# Patient Record
Sex: Female | Born: 2000 | Race: White | Hispanic: No | Marital: Married | Smoking: Never smoker
Health system: Southern US, Community
[De-identification: ages and names within clinical notes are randomized; demographics above are authoritative.]

## PROBLEM LIST (undated history)

## (undated) DIAGNOSIS — Z8489 Family history of other specified conditions: Secondary | ICD-10-CM

## (undated) DIAGNOSIS — F419 Anxiety disorder, unspecified: Secondary | ICD-10-CM

## (undated) DIAGNOSIS — K219 Gastro-esophageal reflux disease without esophagitis: Secondary | ICD-10-CM

## (undated) DIAGNOSIS — E282 Polycystic ovarian syndrome: Secondary | ICD-10-CM

## (undated) DIAGNOSIS — F32A Depression, unspecified: Secondary | ICD-10-CM

## (undated) DIAGNOSIS — R519 Headache, unspecified: Secondary | ICD-10-CM

## (undated) HISTORY — PX: WISDOM TOOTH EXTRACTION: SHX21

---
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
## 2018-02-11 ENCOUNTER — Encounter (HOSPITAL_COMMUNITY): Payer: Self-pay | Admitting: Emergency Medicine

## 2018-02-11 ENCOUNTER — Emergency Department (HOSPITAL_COMMUNITY)
Admission: EM | Admit: 2018-02-11 | Discharge: 2018-02-11 | Disposition: A | Discharge status: Home / Self Care | Payer: 59 | Attending: Emergency Medicine | Admitting: Emergency Medicine

## 2018-02-11 DIAGNOSIS — G43909 Migraine, unspecified, not intractable, without status migrainosus: Secondary | ICD-10-CM | POA: Diagnosis not present

## 2018-02-11 DIAGNOSIS — Z5321 Procedure and treatment not carried out due to patient leaving prior to being seen by health care provider: Secondary | ICD-10-CM | POA: Insufficient documentation

## 2018-02-11 HISTORY — DX: Polycystic ovarian syndrome: E28.2

## 2018-02-11 NOTE — ED Triage Notes (Signed)
Pt c/o migraine x "a few hours" today. Sensitivity to light and sound, some nausea. Hx of migraines, has tried OTC medications without relief.

## 2018-02-11 NOTE — ED Notes (Signed)
Pt and visitor leaving at this time, do not want to wait. LWBS

## 2018-02-12 NOTE — ED Notes (Signed)
02/12/2018, Follow -up call completed. 

## 2018-07-14 DIAGNOSIS — N760 Acute vaginitis: Secondary | ICD-10-CM | POA: Diagnosis not present

## 2018-07-14 DIAGNOSIS — Z118 Encounter for screening for other infectious and parasitic diseases: Secondary | ICD-10-CM | POA: Diagnosis not present

## 2018-09-10 DIAGNOSIS — Z6841 Body Mass Index (BMI) 40.0 and over, adult: Secondary | ICD-10-CM | POA: Diagnosis not present

## 2018-10-07 DIAGNOSIS — Z6841 Body Mass Index (BMI) 40.0 and over, adult: Secondary | ICD-10-CM | POA: Diagnosis not present

## 2018-12-02 DIAGNOSIS — Z118 Encounter for screening for other infectious and parasitic diseases: Secondary | ICD-10-CM | POA: Diagnosis not present

## 2018-12-02 DIAGNOSIS — Z6841 Body Mass Index (BMI) 40.0 and over, adult: Secondary | ICD-10-CM | POA: Diagnosis not present

## 2018-12-02 DIAGNOSIS — Z01419 Encounter for gynecological examination (general) (routine) without abnormal findings: Secondary | ICD-10-CM | POA: Diagnosis not present

## 2018-12-02 DIAGNOSIS — Z13220 Encounter for screening for lipoid disorders: Secondary | ICD-10-CM | POA: Diagnosis not present

## 2018-12-02 DIAGNOSIS — E282 Polycystic ovarian syndrome: Secondary | ICD-10-CM | POA: Diagnosis not present

## 2019-11-23 ENCOUNTER — Other Ambulatory Visit: Payer: Self-pay

## 2019-11-23 DIAGNOSIS — Z20822 Contact with and (suspected) exposure to covid-19: Secondary | ICD-10-CM

## 2019-11-25 LAB — NOVEL CORONAVIRUS, NAA: SARS-CoV-2, NAA: NOT DETECTED

## 2019-12-07 ENCOUNTER — Ambulatory Visit: Payer: 59 | Attending: Internal Medicine

## 2019-12-07 DIAGNOSIS — Z20822 Contact with and (suspected) exposure to covid-19: Secondary | ICD-10-CM

## 2019-12-08 LAB — NOVEL CORONAVIRUS, NAA: SARS-CoV-2, NAA: DETECTED — AB

## 2019-12-15 ENCOUNTER — Encounter: Payer: 59 | Admitting: Family Medicine

## 2019-12-22 ENCOUNTER — Ambulatory Visit: Payer: 59 | Attending: Internal Medicine

## 2019-12-22 DIAGNOSIS — Z20822 Contact with and (suspected) exposure to covid-19: Secondary | ICD-10-CM

## 2019-12-24 LAB — NOVEL CORONAVIRUS, NAA: SARS-CoV-2, NAA: NOT DETECTED

## 2020-01-24 ENCOUNTER — Encounter: Payer: 59 | Admitting: Family Medicine

## 2022-06-05 ENCOUNTER — Inpatient Hospital Stay (HOSPITAL_COMMUNITY)
Admission: AD | Admit: 2022-06-05 | Payer: Commercial Managed Care - PPO | Source: Home / Self Care | Admitting: Obstetrics and Gynecology

## 2022-06-27 LAB — OB RESULTS CONSOLE HEPATITIS B SURFACE ANTIGEN: Hepatitis B Surface Ag: NEGATIVE

## 2022-06-27 LAB — OB RESULTS CONSOLE HIV ANTIBODY (ROUTINE TESTING): HIV: NONREACTIVE

## 2022-06-27 LAB — HEPATITIS C ANTIBODY: HCV Ab: NEGATIVE

## 2022-06-27 LAB — OB RESULTS CONSOLE RUBELLA ANTIBODY, IGM: Rubella: IMMUNE

## 2022-08-11 ENCOUNTER — Encounter (HOSPITAL_BASED_OUTPATIENT_CLINIC_OR_DEPARTMENT_OTHER): Payer: Self-pay

## 2022-08-11 DIAGNOSIS — R1013 Epigastric pain: Secondary | ICD-10-CM | POA: Diagnosis not present

## 2022-08-11 DIAGNOSIS — O26892 Other specified pregnancy related conditions, second trimester: Secondary | ICD-10-CM | POA: Diagnosis present

## 2022-08-11 LAB — CBC WITH DIFFERENTIAL/PLATELET
Abs Immature Granulocytes: 0.03 10*3/uL (ref 0.00–0.07)
Basophils Absolute: 0 10*3/uL (ref 0.0–0.1)
Basophils Relative: 0 %
Eosinophils Absolute: 0 10*3/uL (ref 0.0–0.5)
Eosinophils Relative: 0 %
HCT: 37.3 % (ref 36.0–46.0)
Hemoglobin: 13.2 g/dL (ref 12.0–15.0)
Immature Granulocytes: 0 %
Lymphocytes Relative: 12 %
Lymphs Abs: 1.4 10*3/uL (ref 0.7–4.0)
MCH: 30.8 pg (ref 26.0–34.0)
MCHC: 35.4 g/dL (ref 30.0–36.0)
MCV: 86.9 fL (ref 80.0–100.0)
Monocytes Absolute: 0.7 10*3/uL (ref 0.1–1.0)
Monocytes Relative: 6 %
Neutro Abs: 9.7 10*3/uL — ABNORMAL HIGH (ref 1.7–7.7)
Neutrophils Relative %: 82 %
Platelets: 246 10*3/uL (ref 150–400)
RBC: 4.29 MIL/uL (ref 3.87–5.11)
RDW: 13.1 % (ref 11.5–15.5)
WBC: 11.8 10*3/uL — ABNORMAL HIGH (ref 4.0–10.5)
nRBC: 0 % (ref 0.0–0.2)

## 2022-08-11 LAB — COMPREHENSIVE METABOLIC PANEL
ALT: 64 U/L — ABNORMAL HIGH (ref 0–44)
AST: 84 U/L — ABNORMAL HIGH (ref 15–41)
Albumin: 4.1 g/dL (ref 3.5–5.0)
Alkaline Phosphatase: 85 U/L (ref 38–126)
Anion gap: 10 (ref 5–15)
BUN: 6 mg/dL (ref 6–20)
CO2: 24 mmol/L (ref 22–32)
Calcium: 9.7 mg/dL (ref 8.9–10.3)
Chloride: 101 mmol/L (ref 98–111)
Creatinine, Ser: 0.54 mg/dL (ref 0.44–1.00)
GFR, Estimated: >60 mL/min (ref >60)
Glucose, Bld: 113 mg/dL — ABNORMAL HIGH (ref 70–99)
Potassium: 3.6 mmol/L (ref 3.5–5.1)
Sodium: 135 mmol/L (ref 135–145)
Total Bilirubin: 1.4 mg/dL — ABNORMAL HIGH (ref 0.3–1.2)
Total Protein: 7.3 g/dL (ref 6.5–8.1)

## 2022-08-11 LAB — URINALYSIS, ROUTINE W REFLEX MICROSCOPIC
Bilirubin Urine: NEGATIVE
Glucose, UA: NEGATIVE mg/dL
Hgb urine dipstick: NEGATIVE
Ketones, ur: 15 mg/dL — AB
Leukocytes,Ua: NEGATIVE
Nitrite: NEGATIVE
Protein, ur: NEGATIVE mg/dL
Specific Gravity, Urine: 1.013 (ref 1.005–1.030)
pH: 6 (ref 5.0–8.0)

## 2022-08-11 LAB — LIPASE, BLOOD: Lipase: 37 U/L (ref 11–51)

## 2022-08-11 NOTE — ED Triage Notes (Signed)
Epigastric pain that is radiating thru to back +N/V began after dinner [redacted] wks pregnant

## 2022-08-12 ENCOUNTER — Emergency Department (HOSPITAL_BASED_OUTPATIENT_CLINIC_OR_DEPARTMENT_OTHER): Payer: Commercial Managed Care - PPO

## 2022-08-12 ENCOUNTER — Emergency Department (HOSPITAL_BASED_OUTPATIENT_CLINIC_OR_DEPARTMENT_OTHER)
Admission: EM | Admit: 2022-08-12 | Discharge: 2022-08-12 | Disposition: A | Discharge status: Home / Self Care | Payer: Commercial Managed Care - PPO | Attending: Emergency Medicine | Admitting: Emergency Medicine

## 2022-08-12 DIAGNOSIS — Z3A16 16 weeks gestation of pregnancy: Secondary | ICD-10-CM

## 2022-08-12 DIAGNOSIS — K8020 Calculus of gallbladder without cholecystitis without obstruction: Secondary | ICD-10-CM

## 2022-08-12 DIAGNOSIS — R1013 Epigastric pain: Secondary | ICD-10-CM

## 2022-08-12 MED ORDER — HYDROCODONE-ACETAMINOPHEN 5-325 MG PO TABS: 1.0000 | ORAL_TABLET | Freq: Four times a day (QID) | ORAL | 0 refills | Status: DC | PRN

## 2022-08-12 NOTE — Discharge Instructions (Addendum)
Take Tylenol 1000 mg every 6 hours as needed for pain.  Take hydrocodone as prescribed as needed for moderate to severe pain.  Follow-up with your OB/GYN on Thursday as scheduled, and return to the ER if you develop worsening pain, high fevers, bloody stools, or for other new and concerning symptoms.  You should avoid spicy or fatty foods as this may worsen your discomfort.

## 2022-08-12 NOTE — ED Provider Notes (Signed)
[facility] Provider Note   CSN: [id number] Arrival date & time: 08/11/22  2028     History  Chief Complaint  Patient presents with   Abdominal Pain    Laurie Rios is a 21 y.o. female.  Patient is a 21 year old female G1 at approximately [redacted] weeks gestation.  Patient presenting today with complaints of epigastric pain.  She reports eating tacos earlier this evening, then experienced severe "gas-like" pains to the epigastric region.  This lasted for approximately 30 minutes, then improved.  She describes ongoing discomfort, however not to the degree that she initially experienced.  She denies any vomiting.  She denies any fevers or chills.  She denies any prior abdominal surgeries.  The history is provided by the patient.       Home Medications Prior to Admission medications   Medication Sig Start Date End Date Taking? Authorizing Provider  ibuprofen (ADVIL,MOTRIN) 600 MG tablet Take 1 tablet (600 mg total) by mouth 3 (three) times daily. Patient taking differently: Take 600 mg by mouth every 6 (six) hours as needed.  09/12/16   Jearld Fenton, NP  sertraline (ZOLOFT) 50 MG tablet Take 50 mg by mouth daily.    [provider]      Allergies    Patient has no known allergies.    Review of Systems   Review of Systems  All other systems reviewed and are negative.   Physical Exam Updated Vital Signs BP 120/86   Pulse 100   Temp 98.2 F (36.8 C)   Resp 19   Ht '5\' 10"'  (1.778 m)   Wt (!) 139.3 kg   SpO2 99%   BMI 44.05 kg/m  Physical Exam Vitals and nursing note reviewed.  Constitutional:      General: She is not in acute distress.    Appearance: She is well-developed. She is not diaphoretic.  HENT:     Head: Normocephalic and atraumatic.  Cardiovascular:     Rate and Rhythm: Normal rate and regular rhythm.     Heart sounds: No murmur heard.    No friction rub. No gallop.  Pulmonary:     Effort: Pulmonary effort is normal.  No respiratory distress.     Breath sounds: Normal breath sounds. No wheezing.  Abdominal:     General: Bowel sounds are normal. There is no distension.     Palpations: Abdomen is soft.     Tenderness: There is abdominal tenderness in the epigastric area. There is no right CVA tenderness, left CVA tenderness, guarding or rebound.  Musculoskeletal:        General: Normal range of motion.     Cervical back: Normal range of motion and neck supple.  Skin:    General: Skin is warm and dry.  Neurological:     General: No focal deficit present.     Mental Status: She is alert and oriented to person, place, and time.     ED Results / Procedures / Treatments   Labs (all labs ordered are listed, but only abnormal results are displayed) Labs Reviewed  COMPREHENSIVE METABOLIC PANEL - Abnormal; Notable for the following components:      Result Value   Glucose, Bld 113 (*)    AST 84 (*)    ALT 64 (*)    Total Bilirubin 1.4 (*)    All other components within normal limits  CBC WITH DIFFERENTIAL/PLATELET - Abnormal; Notable for the following components:   WBC 11.8 (*)  Neutro Abs 9.7 (*)    All other components within normal limits  URINALYSIS, ROUTINE W REFLEX MICROSCOPIC - Abnormal; Notable for the following components:   Ketones, ur 15 (*)    All other components within normal limits  LIPASE, BLOOD    EKG None  Radiology US Abdomen Limited RUQ (LIVER/GB)  Result Date: 08/12/2022 CLINICAL DATA:  Right upper quadrant pain x1 day. EXAM: ULTRASOUND ABDOMEN LIMITED RIGHT UPPER QUADRANT COMPARISON:  None Available. FINDINGS: Gallbladder: Echogenic sludge and small shadowing echogenic gallstones are seen within the gallbladder lumen. The largest gallstone measures approximately 5 mm. There is no evidence of gallbladder wall thickening (2.1 mm). No sonographic Murphy sign noted by sonographer. Common bile duct: Diameter: 3.2 mm Liver: No focal lesion identified. Diffusely increased  echogenicity of the liver parenchyma is noted. Portal vein is patent on color Doppler imaging with normal direction of blood flow towards the liver. Other: None. IMPRESSION: 1. Gallstones and gallbladder sludge without evidence of cholelithiasis. 2. Hepatic steatosis without focal liver lesions. Electronically Signed   By: Virgina Norfolk M.D.   On: 08/12/2022 03:00    Procedures Procedures    Medications Ordered in ED Medications - No data to display  ED Course/ Medical Decision Making/ A&P  Patient is a 21 year old female G1 at [redacted] weeks gestation presenting with epigastric pain that sounds like a gallbladder attack.  Her pain came suddenly after eating tacos this evening.  Pain has improved significantly.  Laboratory studies are unremarkable with the exception of mild elevation of the transaminases, but normal alk phos.  Patient went for ultrasound showing sludge and gallstones, but no evidence for acute cholecystitis.  She does seem to be feeling better and I feel can safely be discharged.  She has a follow-up appointment with her OB this coming Thursday.  Patient understands to avoid spicy or fatty foods.  Return as needed if symptoms worsen or change.  Final Clinical Impression(s) / ED Diagnoses Final diagnoses:  None    Rx / DC Orders ED Discharge Orders     None         Veryl Speak, MD 08/12/22 [phone]

## 2022-10-31 LAB — OB RESULTS CONSOLE RPR: RPR: NONREACTIVE

## 2022-12-15 NOTE — L&D Delivery Note (Signed)
   Delivery Note:   G1P0 at [redacted]w[redacted]d  Admitting diagnosis: Normal labor [O80, Z37.9] Risks: gHTN, morbid obesity, depression on Zoloft Onset of labor: 01/05/2023 at 0430 IOL/Augmentation: AROM and Pitocin ROM: 01/05/2023 at 0956, clear fluid  Complete dilation at 01/05/2023  1653 Onset of pushing at 1806 FHR second stage Cat I  Analgesia/Anesthesia intrapartum:Epidural  Pushing in lithotomy position with CNM and L&D staff support. Husband, Laurie Rios, and mother, Laurie Rios, present for birth and supportive.  Delivery of a Live born female  Birth Weight:  pending APGAR: 75, 9  Newborn Delivery   Birth date/time: 01/05/2023 18:24:00 Delivery type: Vaginal, Spontaneous     in cephalic presentation, position OA to ROA.  APGAR:1 min-9 , 5 min-9   Nuchal Cord: No  Short cord and when baby placed on maternal abdomen, cord ruptured. Double clamped and cut. Minimal blood loss noted. Cord double clamped after cessation of pulsation, cut by Laurie Rios.  Collection of cord blood for typing completed. Arterial cord blood sample-No    Placenta delivered-Spontaneous  with 3 vessels . Uterotonics: Pitocin Placenta to L&D Uterine tone firm  Bleeding small  Sulcus;2nd degree;Perineal  laceration identified.  Episiotomy:None  Local analgesia: N/A  Repair: 2-0 in usual fashion with excellent hemostasis and approximation. Est. Blood Loss (PY):051.10   Complications: None  Mom to postpartum. Baby Laurie Rios to Couplet care / Skin to Skin.  Delivery Report:  Review the Delivery Report for details.    Suzan Nailer, CNM, MSN 01/05/2023, 7:00 PM

## 2022-12-26 LAB — OB RESULTS CONSOLE GBS: GBS: NEGATIVE

## 2022-12-26 LAB — OB RESULTS CONSOLE GC/CHLAMYDIA
Chlamydia: NEGATIVE
Neisseria Gonorrhea: NEGATIVE

## 2023-01-02 ENCOUNTER — Inpatient Hospital Stay (EMERGENCY_DEPARTMENT_HOSPITAL)
Admission: AD | Admit: 2023-01-02 | Discharge: 2023-01-02 | Disposition: A | Discharge status: Home / Self Care | Payer: Commercial Managed Care - PPO | Source: Home / Self Care | Attending: Obstetrics and Gynecology | Admitting: Obstetrics and Gynecology

## 2023-01-02 ENCOUNTER — Encounter (HOSPITAL_COMMUNITY): Payer: Self-pay

## 2023-01-02 DIAGNOSIS — O99343 Other mental disorders complicating pregnancy, third trimester: Secondary | ICD-10-CM | POA: Insufficient documentation

## 2023-01-02 DIAGNOSIS — Z3A37 37 weeks gestation of pregnancy: Secondary | ICD-10-CM | POA: Insufficient documentation

## 2023-01-02 DIAGNOSIS — O99283 Endocrine, nutritional and metabolic diseases complicating pregnancy, third trimester: Secondary | ICD-10-CM | POA: Insufficient documentation

## 2023-01-02 DIAGNOSIS — O133 Gestational [pregnancy-induced] hypertension without significant proteinuria, third trimester: Secondary | ICD-10-CM | POA: Insufficient documentation

## 2023-01-02 DIAGNOSIS — F419 Anxiety disorder, unspecified: Secondary | ICD-10-CM | POA: Insufficient documentation

## 2023-01-02 DIAGNOSIS — R519 Headache, unspecified: Secondary | ICD-10-CM | POA: Insufficient documentation

## 2023-01-02 DIAGNOSIS — F32A Depression, unspecified: Secondary | ICD-10-CM | POA: Insufficient documentation

## 2023-01-02 DIAGNOSIS — E282 Polycystic ovarian syndrome: Secondary | ICD-10-CM | POA: Insufficient documentation

## 2023-01-02 DIAGNOSIS — O134 Gestational [pregnancy-induced] hypertension without significant proteinuria, complicating childbirth: Secondary | ICD-10-CM | POA: Diagnosis not present

## 2023-01-02 DIAGNOSIS — O26893 Other specified pregnancy related conditions, third trimester: Secondary | ICD-10-CM | POA: Insufficient documentation

## 2023-01-02 DIAGNOSIS — Z79899 Other long term (current) drug therapy: Secondary | ICD-10-CM | POA: Insufficient documentation

## 2023-01-02 HISTORY — DX: Anxiety disorder, unspecified: F41.9

## 2023-01-02 HISTORY — DX: Depression, unspecified: F32.A

## 2023-01-02 LAB — TYPE AND SCREEN
ABO/RH(D): A POS
Antibody Screen: NEGATIVE

## 2023-01-02 LAB — COMPREHENSIVE METABOLIC PANEL
ALT: 13 U/L (ref 0–44)
AST: 17 U/L (ref 15–41)
Albumin: 2.7 g/dL — ABNORMAL LOW (ref 3.5–5.0)
Alkaline Phosphatase: 136 U/L — ABNORMAL HIGH (ref 38–126)
Anion gap: 9 (ref 5–15)
BUN: 7 mg/dL (ref 6–20)
CO2: 19 mmol/L — ABNORMAL LOW (ref 22–32)
Calcium: 9 mg/dL (ref 8.9–10.3)
Chloride: 105 mmol/L (ref 98–111)
Creatinine, Ser: 0.57 mg/dL (ref 0.44–1.00)
GFR, Estimated: >60 mL/min (ref >60)
Glucose, Bld: 81 mg/dL (ref 70–99)
Potassium: 3.9 mmol/L (ref 3.5–5.1)
Sodium: 133 mmol/L — ABNORMAL LOW (ref 135–145)
Total Bilirubin: 0.5 mg/dL (ref 0.3–1.2)
Total Protein: 6.4 g/dL — ABNORMAL LOW (ref 6.5–8.1)

## 2023-01-02 LAB — CBC
HCT: 37.7 % (ref 36.0–46.0)
Hemoglobin: 12.8 g/dL (ref 12.0–15.0)
MCH: 30.1 pg (ref 26.0–34.0)
MCHC: 34 g/dL (ref 30.0–36.0)
MCV: 88.7 fL (ref 80.0–100.0)
Platelets: 239 10*3/uL (ref 150–400)
RBC: 4.25 MIL/uL (ref 3.87–5.11)
RDW: 13 % (ref 11.5–15.5)
WBC: 9.9 10*3/uL (ref 4.0–10.5)
nRBC: 0 % (ref 0.0–0.2)

## 2023-01-02 LAB — PROTEIN / CREATININE RATIO, URINE
Creatinine, Urine: 51 mg/dL
Protein Creatinine Ratio: 0.24 mg/mg{Cre} — ABNORMAL HIGH (ref 0.00–0.15)
Total Protein, Urine: 12 mg/dL

## 2023-01-02 MED ORDER — ACETAMINOPHEN-CAFFEINE 500-65 MG PO TABS
2.0000 | ORAL_TABLET | Freq: Once | ORAL | Status: AC
  Administered 2023-01-02: 2 via ORAL
  Filled 2023-01-02: qty 2

## 2023-01-02 NOTE — MAU Note (Signed)
...  Laurie Rios is a 22 y.o. at [redacted]w[redacted]d here in MAU reporting: Sent over from the office for blood pressure evaluation. She reports her pressures in the office were 155/?, 177/90, and 135/101. She denies visual disturbances and epigastric/RUQ pain. HA since 1000 this morning. She reports she has not taken any medication for her HA. Reports a history of HA's and migraines. Denies VB or LOF. +FM.   Elevated BP's in the office noted in patient's prenatal record on 1/12. 138/92 and 140/92.  Onset of complaint: Today Pain score: 2/10 HA - anterior   FHT: 155 initial external Lab orders placed from triage:  UA

## 2023-01-02 NOTE — MAU Provider Note (Addendum)
History     CSN: [id number]  Arrival date and time: 01/02/23 1533   Event Date/Time   First Provider Initiated Contact with Patient 01/02/23 1604      Chief Complaint  Patient presents with   Hypertension   Headache   HPI Laurie Rios is a 22 y.o. G1P0 at [redacted]w[redacted]d who presents to MAU from office for evaluation following severe range BP.  Patient reports readings of 177/90 and 135/101 in the office today. She states she had mildly elevated blood pressures last week in the office. She denies RUQ/epigastric pain, new onset swelling or weight gain.  Patient also reports headache in the setting of history of headaches. Pain score is 2-3/10. She would not typically take medication for a headache of this level.   Sung receives care with [facility]. Dr. Lanny Cramp is her primary.  OB History     Gravida  1   Para      Term      Preterm      AB      Living         SAB      IAB      Ectopic      Multiple      Live Births              Past Medical History:  Diagnosis Date   Anxiety    Depression    PCOS (polycystic ovarian syndrome)     Past Surgical History:  Procedure Laterality Date   WISDOM TOOTH EXTRACTION      Family History  Problem Relation Age of Onset   Diabetes Mother    Diabetes Father    Diabetes Maternal Grandmother     Social History   Tobacco Use   Smoking status: Never   Smokeless tobacco: Never  Vaping Use   Vaping Use: Never used  Substance Use Topics   Alcohol use: No   Drug use: No    Allergies: No Known Allergies  Medications Prior to Admission  Medication Sig Dispense Refill Last Dose   omeprazole (PRILOSEC OTC) 20 MG tablet Take 20 mg by mouth daily.   01/01/2023   sertraline (ZOLOFT) 50 MG tablet Take 50 mg by mouth daily.   01/01/2023   HYDROcodone-acetaminophen (NORCO) 5-325 MG tablet Take 1-2 tablets by mouth every 6 (six) hours as needed. 10 tablet 0    ibuprofen (ADVIL,MOTRIN) 600 MG tablet Take 1 tablet (600 mg total)  by mouth 3 (three) times daily. (Patient taking differently: Take 600 mg by mouth every 6 (six) hours as needed. ) 30 tablet 0     Review of Systems  Neurological:  Positive for headaches.  All other systems reviewed and are negative.  Physical Exam   Blood pressure 122/87, pulse (!) 104, temperature 98.1 F (36.7 C), temperature source Oral, resp. rate 17, height 5' 9.5" (1.765 m), SpO2 99 %.  Physical Exam Vitals and nursing note reviewed.  Constitutional:      Appearance: She is well-developed. She is not ill-appearing.  Cardiovascular:     Rate and Rhythm: Normal rate.  Pulmonary:     Effort: Pulmonary effort is normal.  Abdominal:     Comments: Gravid  Musculoskeletal:     Right lower leg: 2+ Edema present.     Left lower leg: 2+ Edema present.  Skin:    Capillary Refill: Capillary refill takes less than 2 seconds.  Neurological:     Mental Status: She is alert.  Psychiatric:        Mood and Affect: Mood normal.        Speech: Speech normal.        Behavior: Behavior normal.     MAU Course  Procedures  MDM  - Reactive tracing: baseline 135, mod var, + accels, no decels  - Toco: rare contractions  -138/112 BP reading contains documentation that cuff was malpositioned, not an accurate reading  - Dr. Murrell Redden contacted at 1800. She will see if there are openings for IOL this weekend. If not, patient will be worked into office schedule Monday 01/05/23  Orders Placed This Encounter  Procedures   Protein / creatinine ratio, urine   CBC   Comprehensive metabolic panel   RPR   Measure blood pressure   Type and screen [place]   Insert peripheral IV   Saline lock IV   Patient Vitals for the past 24 hrs:  BP Temp Temp src Pulse Resp SpO2 Height  01/02/23 1801 (!) 140/101 -- -- (!) 109 -- -- --  01/02/23 1749 (!) 119/58 -- -- 94 -- -- --  01/02/23 1746 (!) 138/112 -- -- 77 -- -- --  01/02/23 1731 (!) 122/48 -- -- 94 -- -- --  01/02/23  1716 137/83 -- -- 92 -- -- --  01/02/23 1701 128/78 -- -- 89 -- -- --  01/02/23 1646 132/82 -- -- 100 -- -- --  01/02/23 1627 122/87 -- -- (!) 104 -- -- --  01/02/23 1625 (!) 108/90 -- -- (!) 108 -- -- --  01/02/23 1553 (!) 146/73 98.1 F (36.7 C) Oral (!) 170 17 99 % 5' 9.5" (1.765 m)   Assessment and Plan  --22 y.o. G1P0 at [redacted]w[redacted]d  --Reactive tracing --Gestational Hypertension --PEC labs WNL --Headache resolved prior to discharge --Discharge home in stable condition with Preeclampsia precautions  F/U: --Dr. Murrell Redden coordinating office appointment Monday for BP check and possible BPP --Patient should be prepared for possible admission following Monday's appointment  Darlina Rumpf, [place], MSN, CNM 01/02/2023, 7:40 PM

## 2023-01-03 LAB — RPR: RPR Ser Ql: NONREACTIVE

## 2023-01-05 ENCOUNTER — Inpatient Hospital Stay (HOSPITAL_COMMUNITY)
Admission: AD | Admit: 2023-01-05 | Discharge: 2023-01-07 | DRG: 807 | Disposition: A | Discharge status: Home / Self Care | Payer: Commercial Managed Care - PPO | Attending: Obstetrics & Gynecology | Admitting: Obstetrics & Gynecology

## 2023-01-05 ENCOUNTER — Inpatient Hospital Stay (HOSPITAL_COMMUNITY): Payer: Commercial Managed Care - PPO | Admitting: Anesthesiology

## 2023-01-05 ENCOUNTER — Encounter (HOSPITAL_COMMUNITY): Payer: Self-pay | Admitting: Obstetrics & Gynecology

## 2023-01-05 ENCOUNTER — Other Ambulatory Visit: Payer: Self-pay

## 2023-01-05 DIAGNOSIS — Z8659 Personal history of other mental and behavioral disorders: Secondary | ICD-10-CM

## 2023-01-05 DIAGNOSIS — O99214 Obesity complicating childbirth: Secondary | ICD-10-CM | POA: Diagnosis present

## 2023-01-05 DIAGNOSIS — O134 Gestational [pregnancy-induced] hypertension without significant proteinuria, complicating childbirth: Principal | ICD-10-CM | POA: Diagnosis present

## 2023-01-05 DIAGNOSIS — O139 Gestational [pregnancy-induced] hypertension without significant proteinuria, unspecified trimester: Secondary | ICD-10-CM | POA: Diagnosis present

## 2023-01-05 DIAGNOSIS — Z3A37 37 weeks gestation of pregnancy: Secondary | ICD-10-CM

## 2023-01-05 DIAGNOSIS — F32A Depression, unspecified: Secondary | ICD-10-CM | POA: Diagnosis present

## 2023-01-05 DIAGNOSIS — F419 Anxiety disorder, unspecified: Secondary | ICD-10-CM | POA: Diagnosis present

## 2023-01-05 DIAGNOSIS — O9902 Anemia complicating childbirth: Secondary | ICD-10-CM | POA: Diagnosis not present

## 2023-01-05 DIAGNOSIS — O99344 Other mental disorders complicating childbirth: Secondary | ICD-10-CM | POA: Diagnosis present

## 2023-01-05 LAB — CBC WITH DIFFERENTIAL/PLATELET
Abs Immature Granulocytes: 0.06 10*3/uL (ref 0.00–0.07)
Basophils Absolute: 0 10*3/uL (ref 0.0–0.1)
Basophils Relative: 0 %
Eosinophils Absolute: 0 10*3/uL (ref 0.0–0.5)
Eosinophils Relative: 0 %
HCT: 33 % — ABNORMAL LOW (ref 36.0–46.0)
Hemoglobin: 11.7 g/dL — ABNORMAL LOW (ref 12.0–15.0)
Immature Granulocytes: 0 %
Lymphocytes Relative: 8 %
Lymphs Abs: 1.3 10*3/uL (ref 0.7–4.0)
MCH: 30.7 pg (ref 26.0–34.0)
MCHC: 35.5 g/dL (ref 30.0–36.0)
MCV: 86.6 fL (ref 80.0–100.0)
Monocytes Absolute: 0.9 10*3/uL (ref 0.1–1.0)
Monocytes Relative: 6 %
Neutro Abs: 14.2 10*3/uL — ABNORMAL HIGH (ref 1.7–7.7)
Neutrophils Relative %: 86 %
Platelets: 227 10*3/uL (ref 150–400)
RBC: 3.81 MIL/uL — ABNORMAL LOW (ref 3.87–5.11)
RDW: 13.1 % (ref 11.5–15.5)
WBC: 16.5 10*3/uL — ABNORMAL HIGH (ref 4.0–10.5)
nRBC: 0 % (ref 0.0–0.2)

## 2023-01-05 LAB — HIV ANTIBODY (ROUTINE TESTING W REFLEX): HIV Screen 4th Generation wRfx: NONREACTIVE

## 2023-01-05 LAB — CBC
HCT: 34.8 % — ABNORMAL LOW (ref 36.0–46.0)
Hemoglobin: 12.1 g/dL (ref 12.0–15.0)
MCH: 30.2 pg (ref 26.0–34.0)
MCHC: 34.8 g/dL (ref 30.0–36.0)
MCV: 86.8 fL (ref 80.0–100.0)
Platelets: 215 10*3/uL (ref 150–400)
RBC: 4.01 MIL/uL (ref 3.87–5.11)
RDW: 13.1 % (ref 11.5–15.5)
WBC: 9.4 10*3/uL (ref 4.0–10.5)
nRBC: 0 % (ref 0.0–0.2)

## 2023-01-05 LAB — COMPREHENSIVE METABOLIC PANEL
ALT: 13 U/L (ref 0–44)
AST: 18 U/L (ref 15–41)
Albumin: 2.6 g/dL — ABNORMAL LOW (ref 3.5–5.0)
Alkaline Phosphatase: 134 U/L — ABNORMAL HIGH (ref 38–126)
Anion gap: 10 (ref 5–15)
BUN: 7 mg/dL (ref 6–20)
CO2: 19 mmol/L — ABNORMAL LOW (ref 22–32)
Calcium: 8.7 mg/dL — ABNORMAL LOW (ref 8.9–10.3)
Chloride: 105 mmol/L (ref 98–111)
Creatinine, Ser: 0.57 mg/dL (ref 0.44–1.00)
GFR, Estimated: >60 mL/min (ref >60)
Glucose, Bld: 103 mg/dL — ABNORMAL HIGH (ref 70–99)
Potassium: 3.4 mmol/L — ABNORMAL LOW (ref 3.5–5.1)
Sodium: 134 mmol/L — ABNORMAL LOW (ref 135–145)
Total Bilirubin: 0.6 mg/dL (ref 0.3–1.2)
Total Protein: 6.1 g/dL — ABNORMAL LOW (ref 6.5–8.1)

## 2023-01-05 LAB — TYPE AND SCREEN
ABO/RH(D): A POS
Antibody Screen: NEGATIVE

## 2023-01-05 LAB — RPR: RPR Ser Ql: NONREACTIVE

## 2023-01-05 MED ORDER — ZOLPIDEM TARTRATE 5 MG PO TABS: 5.0000 mg | ORAL_TABLET | Freq: Every evening | ORAL | Status: DC | PRN

## 2023-01-05 MED ORDER — ONDANSETRON HCL 4 MG/2ML IJ SOLN
4.0000 mg | Freq: Four times a day (QID) | INTRAMUSCULAR | Status: DC | PRN
  Administered 2023-01-05: 4 mg via INTRAVENOUS
  Filled 2023-01-05: qty 2

## 2023-01-05 MED ORDER — SODIUM CHLORIDE 0.9% FLUSH: 3.0000 mL | INTRAVENOUS | Status: DC | PRN

## 2023-01-05 MED ORDER — FENTANYL-BUPIVACAINE-NACL 0.5-0.125-0.9 MG/250ML-% EP SOLN: 12.0000 mL/h | EPIDURAL | Status: DC | PRN

## 2023-01-05 MED ORDER — SENNOSIDES-DOCUSATE SODIUM 8.6-50 MG PO TABS
2.0000 | ORAL_TABLET | Freq: Every day | ORAL | Status: DC
  Administered 2023-01-06 – 2023-01-07 (×2): 2 via ORAL
  Filled 2023-01-05 (×2): qty 2

## 2023-01-05 MED ORDER — OXYTOCIN-SODIUM CHLORIDE 30-0.9 UT/500ML-% IV SOLN
1.0000 m[IU]/min | INTRAVENOUS | Status: DC
  Administered 2023-01-05: 2 m[IU]/min via INTRAVENOUS

## 2023-01-05 MED ORDER — OXYTOCIN-SODIUM CHLORIDE 30-0.9 UT/500ML-% IV SOLN
2.5000 [IU]/h | INTRAVENOUS | Status: DC
  Filled 2023-01-05: qty 500

## 2023-01-05 MED ORDER — ACETAMINOPHEN 500 MG PO TABS: 1000.0000 mg | ORAL_TABLET | Freq: Four times a day (QID) | ORAL | Status: DC | PRN

## 2023-01-05 MED ORDER — LIDOCAINE HCL (PF) 1 % IJ SOLN
INTRAMUSCULAR | Status: DC | PRN
  Administered 2023-01-05: 8 mL via EPIDURAL

## 2023-01-05 MED ORDER — TETANUS-DIPHTH-ACELL PERTUSSIS 5-2.5-18.5 LF-MCG/0.5 IM SUSY: 0.5000 mL | PREFILLED_SYRINGE | Freq: Once | INTRAMUSCULAR | Status: DC

## 2023-01-05 MED ORDER — TERBUTALINE SULFATE 1 MG/ML IJ SOLN: 0.2500 mg | Freq: Once | INTRAMUSCULAR | Status: DC | PRN

## 2023-01-05 MED ORDER — PHENYLEPHRINE 80 MCG/ML (10ML) SYRINGE FOR IV PUSH (FOR BLOOD PRESSURE SUPPORT): 80.0000 ug | PREFILLED_SYRINGE | INTRAVENOUS | Status: DC | PRN

## 2023-01-05 MED ORDER — LACTATED RINGERS IV SOLN: 500.0000 mL | INTRAVENOUS | Status: DC | PRN

## 2023-01-05 MED ORDER — LIDOCAINE HCL (PF) 1 % IJ SOLN: 30.0000 mL | INTRAMUSCULAR | Status: DC | PRN

## 2023-01-05 MED ORDER — LACTATED RINGERS IV SOLN: INTRAVENOUS | Status: DC

## 2023-01-05 MED ORDER — IBUPROFEN 600 MG PO TABS
600.0000 mg | ORAL_TABLET | Freq: Four times a day (QID) | ORAL | Status: DC
  Administered 2023-01-06 – 2023-01-07 (×6): 600 mg via ORAL
  Filled 2023-01-05 (×6): qty 1

## 2023-01-05 MED ORDER — ONDANSETRON HCL 4 MG PO TABS: 4.0000 mg | ORAL_TABLET | ORAL | Status: DC | PRN

## 2023-01-05 MED ORDER — WITCH HAZEL-GLYCERIN EX PADS: 1.0000 | MEDICATED_PAD | CUTANEOUS | Status: DC | PRN

## 2023-01-05 MED ORDER — DIPHENHYDRAMINE HCL 25 MG PO CAPS: 25.0000 mg | ORAL_CAPSULE | Freq: Four times a day (QID) | ORAL | Status: DC | PRN

## 2023-01-05 MED ORDER — COCONUT OIL OIL: 1.0000 | TOPICAL_OIL | Status: DC | PRN

## 2023-01-05 MED ORDER — FENTANYL-BUPIVACAINE-NACL 0.5-0.125-0.9 MG/250ML-% EP SOLN
12.0000 mL/h | EPIDURAL | Status: DC | PRN
  Administered 2023-01-05: 12 mL/h via EPIDURAL
  Filled 2023-01-05: qty 250

## 2023-01-05 MED ORDER — DIBUCAINE (PERIANAL) 1 % EX OINT: 1.0000 | TOPICAL_OINTMENT | CUTANEOUS | Status: DC | PRN

## 2023-01-05 MED ORDER — OXYTOCIN 10 UNIT/ML IJ SOLN: 10.0000 [IU] | Freq: Once | INTRAMUSCULAR | Status: DC

## 2023-01-05 MED ORDER — PRENATAL MULTIVITAMIN CH
1.0000 | ORAL_TABLET | Freq: Every day | ORAL | Status: DC
  Administered 2023-01-05 – 2023-01-06 (×2): 1 via ORAL
  Filled 2023-01-05 (×2): qty 1

## 2023-01-05 MED ORDER — SIMETHICONE 80 MG PO CHEW: 80.0000 mg | CHEWABLE_TABLET | ORAL | Status: DC | PRN

## 2023-01-05 MED ORDER — SOD CITRATE-CITRIC ACID 500-334 MG/5ML PO SOLN: 30.0000 mL | ORAL | Status: DC | PRN

## 2023-01-05 MED ORDER — EPHEDRINE 5 MG/ML INJ: 10.0000 mg | INTRAVENOUS | Status: DC | PRN

## 2023-01-05 MED ORDER — ACETAMINOPHEN 325 MG PO TABS
650.0000 mg | ORAL_TABLET | ORAL | Status: DC | PRN
  Administered 2023-01-06 – 2023-01-07 (×3): 650 mg via ORAL
  Filled 2023-01-05 (×3): qty 2

## 2023-01-05 MED ORDER — SERTRALINE HCL 50 MG PO TABS
50.0000 mg | ORAL_TABLET | Freq: Every day | ORAL | Status: DC
  Administered 2023-01-05 – 2023-01-06 (×2): 50 mg via ORAL
  Filled 2023-01-05 (×2): qty 1

## 2023-01-05 MED ORDER — SODIUM CHLORIDE 0.9 % IV SOLN: INTRAVENOUS | Status: DC | PRN

## 2023-01-05 MED ORDER — DIPHENHYDRAMINE HCL 50 MG/ML IJ SOLN: 12.5000 mg | INTRAMUSCULAR | Status: DC | PRN

## 2023-01-05 MED ORDER — LACTATED RINGERS IV SOLN
500.0000 mL | Freq: Once | INTRAVENOUS | Status: AC
  Administered 2023-01-05: 500 mL via INTRAVENOUS

## 2023-01-05 MED ORDER — ONDANSETRON HCL 4 MG/2ML IJ SOLN: 4.0000 mg | INTRAMUSCULAR | Status: DC | PRN

## 2023-01-05 MED ORDER — BENZOCAINE-MENTHOL 20-0.5 % EX AERO
1.0000 | INHALATION_SPRAY | CUTANEOUS | Status: DC | PRN
  Administered 2023-01-06: 1 via TOPICAL
  Filled 2023-01-05: qty 56

## 2023-01-05 MED ORDER — SODIUM CHLORIDE 0.9% FLUSH: 3.0000 mL | Freq: Two times a day (BID) | INTRAVENOUS | Status: DC

## 2023-01-05 MED ORDER — OXYTOCIN BOLUS FROM INFUSION
333.0000 mL | Freq: Once | INTRAVENOUS | Status: AC
  Administered 2023-01-05: 333 mL via INTRAVENOUS

## 2023-01-05 MED ORDER — FENTANYL CITRATE (PF) 100 MCG/2ML IJ SOLN
50.0000 ug | INTRAMUSCULAR | Status: DC | PRN
  Administered 2023-01-05 (×2): 100 ug via INTRAVENOUS
  Filled 2023-01-05 (×2): qty 2

## 2023-01-05 NOTE — Progress Notes (Signed)
S: Working through contractions. Has had several doses of Fentanyl. Family at the bedside.  O: Vitals:   01/05/23 1332 01/05/23 1339 01/05/23 1342 01/05/23 1348  BP: (!) 157/101 (!) 169/99 (!) 157/89 (!) 147/82  Pulse: 74 92 72 76  Resp: 18 18 18 18   Temp:      TempSrc:      SpO2: 97% 100% 99%   Weight:      Height:       FHT:  FHR: 115 bpm, variability: moderate,  accelerations:  Present,  decelerations:  Absent UC:   regular, every 2-3 minutes SVE:   Dilation: 6 Effacement (%): 100 Station: -2 Exam by:: A. Nickol Collister CNM  A / P: Augmentation of labor, progressing well  Fetal Wellbeing:  Category I PEC: BPs mild range, denies PEC symptoms, labs stable, no signs or symptoms of toxicity GBS: Negative Pain Control:  Epidural Anticipated MOD:  NSVD  Plan for epidural now. Continue Pitocin. Encourage frequent position changes to facilitate fetal rotation and descent.  Dr. Ronita Hipps updated on patient status and plan of care.   Suzan Nailer, CNM, MSN 01/05/2023, 1:51 PM

## 2023-01-05 NOTE — MAU Note (Signed)
Laurie Rios is a 22 y.o. at [redacted]w[redacted]d here in MAU reporting: ctx since 0430 every 5-74min. Denies LOF or bleeding. Last SVE in office was 1cm on Friday. Positive FM

## 2023-01-05 NOTE — H&P (Signed)
Laurie Rios is a 22 y.o. female presenting for early labor. She also has gestational HTN diagnosis on 1/19 MAU visit and she was advised office f/up due to BPs improving and no room for IOL on 1/19 She presents w/ UCs since 3-4 hrs, q  6-7 min. No LOF or Vag bleeding. +FMs noted. Denies HA/ vision changes and RUQ pain.   [facility] Dr Lanny Cramp her Primary Ob Dating by 7 wk sono PCOS, spontaneous conception BMI 40+, famHx of PEC and G1-- bbASA for PEC risk reduction Anxiety/Depression - Zoloft 50mg  helping  Gall stones w/ ED visit  NIPT low risk, Rub Immune. VZ- Non immune, GBS neg  Anatomy normal  Growth at 36 wks 7 lbs 76% AC 94% Vx, nl AFI  No GDM Wkly NST from 36 wks reactive  MAU 1/19 for high BPs, labs nl. P/C 0.24 and was sent home    OB History     Gravida  1   Para      Term      Preterm      AB      Living         SAB      IAB      Ectopic      Multiple      Live Births             Past Medical History:  Diagnosis Date   Anxiety    Depression    PCOS (polycystic ovarian syndrome)    Past Surgical History:  Procedure Laterality Date   WISDOM TOOTH EXTRACTION     Family History: family history includes Diabetes in her father, maternal grandmother, and mother. Social History:  reports that she has never smoked. She has never used smokeless tobacco. She reports that she does not drink alcohol and does not use drugs.     Maternal Diabetes: No Genetic Screening: Normal Maternal Ultrasounds/Referrals: Normal Fetal Ultrasounds or other Referrals:  None Maternal Substance Abuse:  No Significant Maternal Medications:  Meds include: Other: bbASA, Omeprazole, Zoloft 50mg   Significant Maternal Lab Results:  Group B Strep negative Number of Prenatal Visits:greater than 3 verified prenatal visits Other Comments:  None  Review of Systems History Dilation: 3.5 Effacement (%): 70 Station: -3 Exam by:: E Jackson RN Blood pressure 139/81,  pulse 93, temperature 98 F (36.7 C), temperature source Oral, resp. rate 18, height 5\' 9"  (1.753 m), weight (!) 147.6 kg. Exam Physical Exam  Prenatal labs: ABO, Rh: --/--/PENDING (01/22 2130) Antibody: PENDING (01/22 0848) Rubella:  Immune VZ- NON Immune  RPR: NON REACTIVE (01/19 1555)  HBsAg:   Neg  Hep C neg  HIV:   Neg GBS:  Neg  Glucola passed  NIPT low risk   Assessment/Plan: 22 yo G1 at 37.5 weeks here in early labor and has diagnosis of Gestational HTN PIH labs now  GBS neg Plan AROM and assess if pitocin needed EFW 7.1/2 lbs, working towards SVD Epidural in active labor, encourage ambulation if BPs non severe   Anxiety/depression - cont PP Zoloft 50 mg VZ- non immune, needs VZ vaccine postpartum   Laurie Rios 01/05/2023, 9:19 AM

## 2023-01-05 NOTE — Anesthesia Preprocedure Evaluation (Signed)
Anesthesia Evaluation  Patient identified by MRN, date of birth, ID band Patient awake    Reviewed: Allergy & Precautions, H&P , NPO status , Patient's Chart, lab work & pertinent test results  Airway Mallampati: I  TM Distance: >3 FB Neck ROM: Full    Dental no notable dental hx. (+) Teeth Intact, Dental Advisory Given   Pulmonary neg pulmonary ROS   Pulmonary exam normal breath sounds clear to auscultation       Cardiovascular hypertension, Normal cardiovascular exam Rhythm:Regular Rate:Normal     Neuro/Psych  PSYCHIATRIC DISORDERS Anxiety Depression    negative neurological ROS  negative psych ROS   GI/Hepatic negative GI ROS, Neg liver ROS,GERD  ,,  Endo/Other  negative endocrine ROS    Renal/GU negative Renal ROS  negative genitourinary   Musculoskeletal negative musculoskeletal ROS (+)    Abdominal  (+) + obese  Peds negative pediatric ROS (+)  Hematology negative hematology ROS (+)   Anesthesia Other Findings   Reproductive/Obstetrics negative OB ROS                             Anesthesia Physical Anesthesia Plan  ASA: 3  Anesthesia Plan: Epidural   Post-op Pain Management: Minimal or no pain anticipated   Induction: Intravenous  PONV Risk Score and Plan: 2 and Treatment may vary due to age or medical condition  Airway Management Planned: Natural Airway and Simple Face Mask  Additional Equipment: None  Intra-op Plan:   Post-operative Plan:   Informed Consent: I have reviewed the patients History and Physical, chart, labs and discussed the procedure including the risks, benefits and alternatives for the proposed anesthesia with the patient or authorized representative who has indicated his/her understanding and acceptance.     Dental Advisory Given  Plan Discussed with: Anesthesiologist  Anesthesia Plan Comments: (Labs checked- platelets confirmed with RN in room.  Fetal heart tracing, per RN, reported to be stable enough for sitting procedure. Discussed epidural, and patient consents to the procedure:  included risk of possible headache,backache, failed block, allergic reaction, and nerve injury. This patient was asked if she had any questions or concerns before the procedure started.)       Anesthesia Quick Evaluation

## 2023-01-05 NOTE — Anesthesia Procedure Notes (Signed)
Epidural Patient location during procedure: OB Start time: 01/05/2023 1:26 PM End time: 01/05/2023 1:32 PM  Staffing Anesthesiologist: Janeece Riggers, MD Performed: resident/CRNA   Preanesthetic Checklist Completed: patient identified, IV checked, site marked, risks and benefits discussed, surgical consent, monitors and equipment checked, pre-op evaluation and timeout performed  Epidural Patient position: sitting Prep: DuraPrep and site prepped and draped Patient monitoring: continuous pulse ox and blood pressure Approach: midline Location: L3-L4 Injection technique: LOR air  Needle:  Needle type: Tuohy  Needle gauge: 17 G Needle length: 9 cm and 9 Needle insertion depth: 9 cm Catheter type: closed end flexible Catheter size: 19 Gauge Catheter at skin depth: 10 cm Test dose: negative  Assessment Events: blood not aspirated, no cerebrospinal fluid, injection not painful, no injection resistance, no paresthesia and negative IV test

## 2023-01-05 NOTE — Progress Notes (Signed)
Laurie Rios is a 22 y.o. G1P0 at [redacted]w[redacted]d by 7 wk sono not c/w LMP.  admitted for early labor with Gest HTN diagnosed oin 1/19   Subjective: UCs are stronger q 5-6 min.   Objective: BP (!) 136/93   Pulse 92   Temp 97.6 F (36.4 C) (Oral)   Resp 18   Ht 5\' 9"  (1.753 m)   Wt (!) 147.6 kg   BMI 48.07 kg/m  Patient Vitals for the past 24 hrs:  BP Temp Temp src Pulse Resp Height Weight  01/05/23 1003 (!) 136/93 97.6 F (36.4 C) Oral 92 18 -- --  01/05/23 0858 139/81 98 F (36.7 C) Oral 93 18 -- --  01/05/23 0803 (!) 155/93 -- -- 92 -- -- --  01/05/23 0738 (!) 148/70 98 F (36.7 C) Oral 99 16 5\' 9"  (1.753 m) (!) 147.6 kg     FHR: 140 bpm, variability: moderate,  accelerations:  Present,  decelerations:  Absent UC:   irregular, every 5-6 minutes SVE:   Dilation: 4 Effacement (%): 100 Station: -3 Exam by:: Dr. Benjie Karvonen AROM clear. Head well applied in cervix, pelvis adequate   Labs: Lab Results  Component Value Date   WBC 9.4 01/05/2023   HGB 12.1 01/05/2023   HCT 34.8 (L) 01/05/2023   MCV 86.8 01/05/2023   PLT 215 01/05/2023   CMP pending, RN aware to call if abn   Assessment / Plan: G1 37.5 wks early spontaneous labor, with Gestational HTN   Urine P/c 0.24 on 1/19, so defer repeating. CMP sent, BPs non severe, no neural ss.   Labor: Progressing normally Preeclampsia:   No Fetal Wellbeing:  Category I Pain Control:   Options dw pt, will consider epidiural  I/D:  n/a Anticipated MOD:  NSVD, 7-7.1/2 lbs.   Pt and family aware Dr Ronita Hipps on call at 1 pm and will have him and CNM Jones work with her for delivery, she accepts CNM care.   [name], MD 01/05/2023, 10:04 AM

## 2023-01-06 DIAGNOSIS — O9902 Anemia complicating childbirth: Secondary | ICD-10-CM | POA: Diagnosis not present

## 2023-01-06 LAB — CBC
HCT: 28.3 % — ABNORMAL LOW (ref 36.0–46.0)
Hemoglobin: 10.1 g/dL — ABNORMAL LOW (ref 12.0–15.0)
MCH: 31.4 pg (ref 26.0–34.0)
MCHC: 35.7 g/dL (ref 30.0–36.0)
MCV: 87.9 fL (ref 80.0–100.0)
Platelets: 187 10*3/uL (ref 150–400)
RBC: 3.22 MIL/uL — ABNORMAL LOW (ref 3.87–5.11)
RDW: 13.2 % (ref 11.5–15.5)
WBC: 10.7 10*3/uL — ABNORMAL HIGH (ref 4.0–10.5)
nRBC: 0 % (ref 0.0–0.2)

## 2023-01-06 MED ORDER — MAGNESIUM OXIDE -MG SUPPLEMENT 400 (240 MG) MG PO TABS
400.0000 mg | ORAL_TABLET | Freq: Every day | ORAL | Status: DC
  Administered 2023-01-06 – 2023-01-07 (×2): 400 mg via ORAL
  Filled 2023-01-06 (×2): qty 1

## 2023-01-06 MED ORDER — POLYSACCHARIDE IRON COMPLEX 150 MG PO CAPS
150.0000 mg | ORAL_CAPSULE | Freq: Every day | ORAL | Status: DC
  Administered 2023-01-06 – 2023-01-07 (×2): 150 mg via ORAL
  Filled 2023-01-06 (×2): qty 1

## 2023-01-06 NOTE — Anesthesia Postprocedure Evaluation (Signed)
Anesthesia Post Note  Patient: Astaria Nanez Wegman  Procedure(s) Performed: AN AD HOC LABOR EPIDURAL     Patient location during evaluation: Mother Baby Anesthesia Type: Epidural Level of consciousness: awake and alert Pain management: pain level controlled Vital Signs Assessment: post-procedure vital signs reviewed and stable Respiratory status: spontaneous breathing, nonlabored ventilation and respiratory function stable Cardiovascular status: stable Postop Assessment: no headache, no backache and epidural receding Anesthetic complications: no   No notable events documented.  Last Vitals:  Vitals:   01/06/23 0458 01/06/23 0802  BP: (!) 118/54 105/74  Pulse: 87 76  Resp: 18 18  Temp: 37.1 C 36.9 C  SpO2: 98%     Last Pain:  Vitals:   01/06/23 0802  TempSrc: Oral  PainSc:    Pain Goal:                   Emileigh Kellett

## 2023-01-06 NOTE — Progress Notes (Signed)
   PPD #1 S/P NSVD  Live born female  Birth Weight: 8 lb 0.8 oz (3650 g) APGAR: 9, 9  Newborn Delivery   Birth date/time: 01/05/2023 18:24:00 Delivery type: Vaginal, Spontaneous     Baby name: Laurie Rios  Delivering provider: Gavin Potters K   Lacerations: Sulcus;2nd degree;Perineal   Circumcision: Planning, depends on peds clearance, possible webbed penis  Feeding: breast and bottle  Pain control at delivery: Epidural   S:  Reports feeling well. Happy with birth experience.              Tolerating PO/No nausea or vomiting             Bleeding is light             Pain controlled with acetaminophen and ibuprofen (OTC)             Up ad lib/ambulatory/voiding without difficulties   O:  A & O x 3, in no apparent distress  Vitals:   01/05/23 2205 01/06/23 0000 01/06/23 0458 01/06/23 0802  BP: 139/89 126/85 (!) 118/54 105/74  Pulse: 90 88 87 76  Resp: 18 18 18 18   Temp: 98.2 F (36.8 C) 98.4 F (36.9 C) 98.8 F (37.1 C) 98.5 F (36.9 C)  TempSrc: Oral  Oral Oral  SpO2:   98%   Weight:      Height:       Recent Labs    01/05/23 2112 01/06/23 0536  WBC 16.5* 10.7*  HGB 11.7* 10.1*  HCT 33.0* 28.3*  PLT 227 187    Blood type: --/--/A POS (01/22 0848)  Rubella: Immune (07/14 0000)   I&O: I/O last 3 completed shifts: In: -  Out: 7416 [Urine:1200; Blood:204]          No intake/output data recorded.  Gen: AAO x 3, NAD Abdomen: soft, non-tender, non-distended Fundus: firm, non-tender, U-1 Perineum: repair intact Lochia: small Extremities: 1+ non-pitting edema, no calf pain or tenderness   A/P:  PPD # 1 22 y.o., G1P1001  Principal Problem:   Postpartum care following vaginal delivery 1/22  Doing well - stable status  Routine post partum orders Active Problems:   Morbid obesity ([place])  Encourage ambulation   Normal labor   Gestational hypertension  BPs WNL after delivery  Denies PEC symptoms  Labs stable  Plan 1 week F/U for BP check   History of  depression  Continue Zoloft 50mg  daily   SVD (spontaneous vaginal delivery)   Second degree perineal laceration and right sulcus laceration with repair  Discussed perineal care and comfort measures.    Maternal anemia, with delivery  Hgb 10.1  Asymptomatic  Start PO Niferex and mag oxide  Anticipate discharge tomorrow.   Suzan Nailer, MSN, CNM 01/06/2023, 10:22 AM

## 2023-01-06 NOTE — Progress Notes (Signed)
[name] was referred for history of depression/anxiety. * Referral screened out by Clinical Social Worker because none of the following criteria appear to apply: ~ History of anxiety/depression during this pregnancy, or of post-partum depression following prior delivery. ~ Diagnosis of anxiety and/or depression within last 3 years OR * [name]'s symptoms currently being treated with medication and/or therapy. [name] has an active Rx for Zoloft.  No concerns were noted in OB record.   Please contact the Clinical Social Worker if needs arise, by [name] request, or if [name] scores greater than 9/yes to question 10 on Edinburgh Postpartum Depression Screen.   Azriella Mattia Boyd-Gilyard, MSW, LCSW Clinical Social Work [phone] 

## 2023-01-06 NOTE — Lactation Note (Signed)
This note was copied from a baby's chart. Lactation Consultation Note  Patient Name: Laurie Rios EUMPN'T Date: 01/06/2023 Reason for consult: Initial assessment;Primapara;1st time breastfeeding;Early term 37-38.6wks Age:24 hours  LC in to see P1 mother of 37.5 week infant. Infant alert and rooting. Assisted with latch and basic breastfeeding teaching. Baby "Laurie Rios" latched well with strong tugs on the breast. He rhythmically fed with alternate breast massage with occasional swallows. Due to infant's gestational age, pumping was discussed for additional stimulation to breast and mother to give expressed milk to baby. Mother is very motivated and engaged with breastfeeding and pumping. She has good family support.  Maternal Data Has patient been taught Hand Expression?: Yes Does the patient have breastfeeding experience prior to this delivery?: No  Feeding Mother's Current Feeding Choice: Breast Milk  LATCH Score Latch: Grasps breast easily, tongue down, lips flanged, rhythmical sucking.  Audible Swallowing: A few with stimulation  Type of Nipple: Everted at rest and after stimulation  Comfort (Breast/Nipple): Soft / non-tender  Hold (Positioning): Assistance needed to correctly position infant at breast and maintain latch.  LATCH Score: 8   Lactation Tools Discussed/Used  DEBP  Interventions Interventions: Breast feeding basics reviewed;Assisted with latch;Skin to skin;Hand express;Breast compression;Adjust position;Support pillows;Education;LC Services brochure;Hand pump;DEBP  Discharge Pump: DEBP;Personal  Consult Status Consult Status: Follow-up Date: 01/07/23 Follow-up type: In-patient    Laurie Rios M 01/06/2023, 11:21 AM

## 2023-01-07 MED ORDER — POLYSACCHARIDE IRON COMPLEX 150 MG PO CAPS: 150.0000 mg | ORAL_CAPSULE | Freq: Every day | ORAL | Status: AC

## 2023-01-07 MED ORDER — ACETAMINOPHEN 325 MG PO TABS: 650.0000 mg | ORAL_TABLET | ORAL | Status: AC | PRN

## 2023-01-07 MED ORDER — MAGNESIUM OXIDE -MG SUPPLEMENT 400 (240 MG) MG PO TABS: 400.0000 mg | ORAL_TABLET | Freq: Every day | ORAL | Status: AC

## 2023-01-07 MED ORDER — SENNOSIDES-DOCUSATE SODIUM 8.6-50 MG PO TABS: 2.0000 | ORAL_TABLET | Freq: Every day | ORAL | Status: AC

## 2023-01-07 MED ORDER — IBUPROFEN 600 MG PO TABS: 600.0000 mg | ORAL_TABLET | Freq: Four times a day (QID) | ORAL | 0 refills | Status: AC

## 2023-01-07 NOTE — Discharge Summary (Signed)
Postpartum Discharge Summary  Date of Service updated 01/07/23     Patient Name: Laurie Rios DOB: October 26, 2001 MRN: [id number]  Date of admission: 01/05/2023 Delivery date:01/05/2023  Delivering provider: Gavin Potters K  Date of discharge: 01/07/2023  Admitting diagnosis: Normal labor [O80, Z37.9] Intrauterine pregnancy: [redacted]w[redacted]d     Secondary diagnosis:  Principal Problem:   Postpartum care following vaginal delivery 1/22 Active Problems:   Morbid obesity ([place])   Normal labor   Gestational hypertension   History of depression   SVD (spontaneous vaginal delivery)   Second degree perineal laceration and right sulcus laceration with repair   Maternal anemia, with delivery  Additional problems: none    Discharge diagnosis: Term Pregnancy Delivered, Gestational Hypertension, and Anemia                                              Post partum procedures: n/a Augmentation: AROM and Pitocin Complications: None  Hospital course: Onset of Labor With Vaginal Delivery      22 y.o. yo G1P1001 at [redacted]w[redacted]d was admitted in Latent Labor on 01/05/2023. Labor course was complicated by none  Membrane Rupture Time/Date: 9:56 AM ,01/05/2023   Delivery Method:Vaginal, Spontaneous  Episiotomy: None  Lacerations:  Sulcus;2nd degree;Perineal  Patient had a postpartum course complicated by none.  She is ambulating, tolerating a regular diet, passing flatus, and urinating well. Patient is discharged home in stable condition on 01/07/23.  Newborn Data: Birth date:01/05/2023  Birth time:6:24 PM  Gender:Female  Living status:Living  Apgars:9 ,9  Weight:3650 g   Magnesium Sulfate received: No BMZ received: No Rhophylac:N/A MMR:N/A T-DaP:Given prenatally  Physical exam  Vitals:   01/06/23 0802 01/06/23 1339 01/06/23 2104 01/07/23 0613  BP: 105/74 137/68 134/62 128/83  Pulse: 76 87 88 79  Resp: 18 18 18 18   Temp: 98.5 F (36.9 C) 98.2 F (36.8 C) 98.3 F (36.8 C) 98 F (36.7 C)  TempSrc: Oral  Oral Oral Oral  SpO2:  97% 98%   Weight:      Height:       General: alert and no distress Lochia: appropriate Uterine Fundus: firm Incision: N/A DVT Evaluation: No evidence of DVT seen on physical exam. Labs: Lab Results  Component Value Date   WBC 10.7 (H) 01/06/2023   HGB 10.1 (L) 01/06/2023   HCT 28.3 (L) 01/06/2023   MCV 87.9 01/06/2023   PLT 187 01/06/2023      Latest Ref Rng & Units 01/05/2023    8:40 AM  CMP  Glucose 70 - 99 mg/dL 103   BUN 6 - 20 mg/dL 7   Creatinine 0.44 - 1.00 mg/dL 0.57   Sodium 135 - 145 mmol/L 134   Potassium 3.5 - 5.1 mmol/L 3.4   Chloride 98 - 111 mmol/L 105   CO2 22 - 32 mmol/L 19   Calcium 8.9 - 10.3 mg/dL 8.7   Total Protein 6.5 - 8.1 g/dL 6.1   Total Bilirubin 0.3 - 1.2 mg/dL 0.6   Alkaline Phos 38 - 126 U/L 134   AST 15 - 41 U/L 18   ALT 0 - 44 U/L 13    Edinburgh Score:    01/07/2023   12:00 AM  Edinburgh Postnatal Depression Scale Screening Tool  I have been able to laugh and see the funny side of things. 0  I have looked  forward with enjoyment to things. 0  I have blamed myself unnecessarily when things went wrong. 2  I have been anxious or worried for no good reason. 2  I have felt scared or panicky for no good reason. 1  Things have been getting on top of me. 0  I have been so unhappy that I have had difficulty sleeping. 0  I have felt sad or miserable. 1  I have been so unhappy that I have been crying. 0  The thought of harming myself has occurred to me. 0  Edinburgh Postnatal Depression Scale Total 6      After visit meds:  Allergies as of 01/07/2023   No Known Allergies      Medication List     STOP taking these medications    aspirin 81 MG chewable tablet   omeprazole 20 MG tablet Commonly known as: PRILOSEC OTC       TAKE these medications    acetaminophen 325 MG tablet Commonly known as: Tylenol Take 2 tablets (650 mg total) by mouth every 4 (four) hours as needed (for pain scale < 4).    ibuprofen 600 MG tablet Commonly known as: ADVIL Take 1 tablet (600 mg total) by mouth every 6 (six) hours.   iron polysaccharides 150 MG capsule Commonly known as: NIFEREX Take 1 capsule (150 mg total) by mouth daily.   magnesium oxide 400 (240 Mg) MG tablet Commonly known as: MAG-OX Take 1 tablet (400 mg total) by mouth daily.   multivitamin-prenatal 27-0.8 MG Tabs tablet Take 1 tablet by mouth daily at 12 noon.   senna-docusate 8.6-50 MG tablet Commonly known as: Senokot-S Take 2 tablets by mouth daily.   sertraline 50 MG tablet Commonly known as: ZOLOFT Take 50 mg by mouth daily.               Discharge Care Instructions  (From admission, onward)           Start     Ordered   01/07/23 0000  Discharge wound care:       Comments: Sitz baths 2 times /day with warm water x 1 week. May add herbals: 1 ounce dried comfrey leaf* 1 ounce calendula flowers 1 ounce lavender flowers  Supplies can be found online at [employer] sources at [employer], [employer]  1/2 ounce dried uva ursi leaves 1/2 ounce witch hazel blossoms (if you can find them) 1/2 ounce dried sage leaf 1/2 cup sea salt Directions: Bring 2 quarts of water to a boil. Turn off heat, and place 1 ounce (approximately 1 large handful) of the above mixed herbs (not the salt) into the pot. Steep, covered, for 30 minutes.  Strain the liquid well with a fine mesh strainer, and discard the herb material. Add 2 quarts of liquid to the tub, along with the 1/2 cup of salt. This medicinal liquid can also be made into compresses and peri-rinses.   01/07/23 0843             Discharge home in stable condition Infant Feeding: Bottle and Breast Infant Disposition:home with mother Discharge instruction: per After Visit Summary and Postpartum booklet. Activity: Advance as tolerated. Pelvic rest for 6 weeks.  Diet: routine diet Anticipated Birth Control: Unsure Postpartum Appointment:6  weeks Additional Postpartum F/U: BP check 1 week Future Appointments:No future appointments. Follow up Visit:  Patient instructed to follow up at [facility] within 1 week for BP check and routine 6 week check.  01/07/2023 Elden Brucato A Kimble Hitchens, DO

## 2023-01-07 NOTE — Plan of Care (Signed)
adequate

## 2023-01-07 NOTE — Lactation Note (Signed)
This note was copied from a baby's chart. Lactation Consultation Note  Patient Name: Laurie Rios NATFT'D Date: 01/07/2023 Reason for consult: Early term 37-38.6wks;Follow-up assessment;1st time breastfeeding;Primapara Age:24 hours  Infant feeding at breast upon entry to room. Baby was latched well, rhythmically feeding with some swallowing noted. Mother reports she has been pumping and got some colostrum in the flange. She has been breastfeeding for 10-20 minutes and supplementing with Enfamil Formula 30-60 ml. She reports concerns about transitioning baby to only breast feeding. Advised to pump in addition to breastfeeding until breast milk comes to volume. Discussed the benefits of OP Lactation feeding assistance and evaluation and mother has requested for referral to be made.      LATCH Score Latch: Grasps breast easily, tongue down, lips flanged, rhythmical sucking.  Audible Swallowing: A few with stimulation  Type of Nipple: Everted at rest and after stimulation  Comfort (Breast/Nipple): Soft / non-tender  Hold (Positioning): No assistance needed to correctly position infant at breast.  LATCH Score: 9    Interventions Interventions: Education  Discharge Discharge Education: Warning signs for feeding baby;Outpatient recommendation;Outpatient Epic message sent;Engorgement and breast care  Consult Status Consult Status: Complete Date: 01/07/23    Gwenevere Abbot 01/07/2023, 11:33 AM

## 2023-01-14 ENCOUNTER — Telehealth (HOSPITAL_COMMUNITY): Payer: Self-pay | Admitting: *Deleted

## 2023-01-14 NOTE — Telephone Encounter (Signed)
Attempted Hospital Discharge Follow-Up Call.  Left voice mail requesting that patient return RN's phone call if patient has any concerns or questions regarding herself or her baby.  

## 2023-02-23 ENCOUNTER — Ambulatory Visit: Payer: Self-pay | Admitting: General Surgery

## 2023-02-23 NOTE — H&P (Signed)
Chief Complaint: New Consultation (Gallbladder)       History of Present Illness: Laurie Rios is a 22 y.o. female who is seen today as an office consultation at the request of Dr. Lanny Rios for evaluation of New Consultation (Gallbladder) .   Patient is a 22 year old female who comes in secondary to abdominal pain.  Patient states that she began having right upper quadrant abdominal pain while [redacted] weeks pregnant.  She states that after delivery over the last several weeks she has had 5 bouts of abdominal pain.  She states that she is try to stay away from any high fatty or spicy foods.  She does state that these particular foods seem to cause her to have pain.  She denies any nausea or vomiting.       She did undergo ultrasound was found to have multiple gallstones.   She has had no previous abdominal surgery.       Review of Systems: A complete review of systems was obtained from the patient.  I have reviewed this information and discussed as appropriate with the patient.  See HPI as well for other ROS.   Review of Systems  Constitutional:  Negative for fever.  HENT:  Negative for congestion.   Eyes:  Negative for blurred vision.  Respiratory:  Negative for cough, shortness of breath and wheezing.   Cardiovascular:  Negative for chest pain and palpitations.  Gastrointestinal:  Positive for abdominal pain and heartburn.  Genitourinary:  Negative for dysuria.  Musculoskeletal:  Negative for myalgias.  Skin:  Negative for rash.  Neurological:  Negative for dizziness and headaches.  Psychiatric/Behavioral:  Negative for depression and suicidal ideas.   All other systems reviewed and are negative.       Medical History: Past Medical History Past Medical History: Diagnosis Date  Anxiety    GERD (gastroesophageal reflux disease)        There is no problem list on file for this patient.     Past Surgical History History reviewed. No pertinent surgical history.     Allergies No  Known Allergies    Current Outpatient Medications on File Prior to Visit Medication Sig Dispense Refill  sertraline (ZOLOFT) 50 MG tablet Take 1 tablet by mouth once daily       No current facility-administered medications on file prior to visit.     Family History Family History Problem Relation Age of Onset  Obesity Mother    High blood pressure (Hypertension) Mother    Diabetes Mother    Obesity Father    Diabetes Father        Social History   Tobacco Use Smoking Status Former  Types: Cigarettes  Quit date: 05/2022  Years since quitting: 0.7 Smokeless Tobacco Never     Social History Social History    Socioeconomic History  Marital status: Married Tobacco Use  Smoking status: Former     Types: Cigarettes     Quit date: 05/2022     Years since quitting: 0.7  Smokeless tobacco: Never Vaping Use  Vaping Use: Never used Substance and Sexual Activity  Alcohol use: Not Currently  Drug use: Never      Objective:     Vitals:   02/23/23 1337 02/23/23 1339 BP: 132/88   Pulse: 107   Temp: 36.6 C (97.9 F)   SpO2: 98%   Weight: (!) 144 kg (317 lb 6.4 oz)   Height: 175.3 cm ('5\' 9"'$ )   PainSc:   0-No pain  Body mass index is 46.87 kg/m.   Physical Exam Constitutional:      Appearance: Normal appearance.  HENT:     Head: Normocephalic and atraumatic.     Mouth/Throat:     Mouth: Mucous membranes are moist.     Pharynx: Oropharynx is clear.  Eyes:     General: No scleral icterus.    Pupils: Pupils are equal, round, and reactive to light.  Cardiovascular:     Rate and Rhythm: Normal rate and regular rhythm.     Pulses: Normal pulses.     Heart sounds: No murmur heard.    No friction rub. No gallop.  Pulmonary:     Effort: Pulmonary effort is normal. No respiratory distress.     Breath sounds: Normal breath sounds. No stridor.  Abdominal:     General: Abdomen is flat.  Musculoskeletal:        General: No swelling.  Skin:    General: Skin  is warm.  Neurological:     General: No focal deficit present.     Mental Status: She is alert and oriented to person, place, and time. Mental status is at baseline.  Psychiatric:        Mood and Affect: Mood normal.        Thought Content: Thought content normal.        Judgment: Judgment normal.        Assessment and Plan: Diagnoses and all orders for this visit:   Symptomatic cholelithiasis     Laurie Rios is a 22 y.o. female    1.  We will proceed to the OR for a lap cholecystectomy. 2. All risks and benefits were discussed with the patient to generally include: infection, bleeding, possible need for post op ERCP, damage to the bile ducts, and bile leak. Alternatives were offered and described.  All questions were answered and the patient voiced understanding of the procedure and wishes to proceed at this point with a laparoscopic cholecystectomy           No follow-ups on file.   Ralene Ok, MD, [facility], [facility]

## 2023-02-23 NOTE — H&P (View-Only) (Signed)
Chief Complaint: New Consultation (Gallbladder)       History of Present Illness: Laurie Rios is a 22 y.o. female who is seen today as an office consultation at the request of Dr. Lanny Rios for evaluation of New Consultation (Gallbladder) .   Patient is a 22 year old female who comes in secondary to abdominal pain.  Patient states that she began having right upper quadrant abdominal pain while [redacted] weeks pregnant.  She states that after delivery over the last several weeks she has had 5 bouts of abdominal pain.  She states that she is try to stay away from any high fatty or spicy foods.  She does state that these particular foods seem to cause her to have pain.  She denies any nausea or vomiting.       She did undergo ultrasound was found to have multiple gallstones.   She has had no previous abdominal surgery.       Review of Systems: A complete review of systems was obtained from the patient.  I have reviewed this information and discussed as appropriate with the patient.  See HPI as well for other ROS.   Review of Systems  Constitutional:  Negative for fever.  HENT:  Negative for congestion.   Eyes:  Negative for blurred vision.  Respiratory:  Negative for cough, shortness of breath and wheezing.   Cardiovascular:  Negative for chest pain and palpitations.  Gastrointestinal:  Positive for abdominal pain and heartburn.  Genitourinary:  Negative for dysuria.  Musculoskeletal:  Negative for myalgias.  Skin:  Negative for rash.  Neurological:  Negative for dizziness and headaches.  Psychiatric/Behavioral:  Negative for depression and suicidal ideas.   All other systems reviewed and are negative.       Medical History: Past Medical History Past Medical History: Diagnosis Date  Anxiety    GERD (gastroesophageal reflux disease)        There is no problem list on file for this patient.     Past Surgical History History reviewed. No pertinent surgical history.     Allergies No  Known Allergies    Current Outpatient Medications on File Prior to Visit Medication Sig Dispense Refill  sertraline (ZOLOFT) 50 MG tablet Take 1 tablet by mouth once daily       No current facility-administered medications on file prior to visit.     Family History Family History Problem Relation Age of Onset  Obesity Mother    High blood pressure (Hypertension) Mother    Diabetes Mother    Obesity Father    Diabetes Father        Social History   Tobacco Use Smoking Status Former  Types: Cigarettes  Quit date: 05/2022  Years since quitting: 0.7 Smokeless Tobacco Never     Social History Social History    Socioeconomic History  Marital status: Married Tobacco Use  Smoking status: Former     Types: Cigarettes     Quit date: 05/2022     Years since quitting: 0.7  Smokeless tobacco: Never Vaping Use  Vaping Use: Never used Substance and Sexual Activity  Alcohol use: Not Currently  Drug use: Never      Objective:     Vitals:   02/23/23 1337 02/23/23 1339 BP: 132/88   Pulse: 107   Temp: 36.6 C (97.9 F)   SpO2: 98%   Weight: (!) 144 kg (317 lb 6.4 oz)   Height: 175.3 cm ('5\' 9"'$ )   PainSc:   0-No pain  Body mass index is 46.87 kg/m.   Physical Exam Constitutional:      Appearance: Normal appearance.  HENT:     Head: Normocephalic and atraumatic.     Mouth/Throat:     Mouth: Mucous membranes are moist.     Pharynx: Oropharynx is clear.  Eyes:     General: No scleral icterus.    Pupils: Pupils are equal, round, and reactive to light.  Cardiovascular:     Rate and Rhythm: Normal rate and regular rhythm.     Pulses: Normal pulses.     Heart sounds: No murmur heard.    No friction rub. No gallop.  Pulmonary:     Effort: Pulmonary effort is normal. No respiratory distress.     Breath sounds: Normal breath sounds. No stridor.  Abdominal:     General: Abdomen is flat.  Musculoskeletal:        General: No swelling.  Skin:    General: Skin  is warm.  Neurological:     General: No focal deficit present.     Mental Status: She is alert and oriented to person, place, and time. Mental status is at baseline.  Psychiatric:        Mood and Affect: Mood normal.        Thought Content: Thought content normal.        Judgment: Judgment normal.        Assessment and Plan: Diagnoses and all orders for this visit:   Symptomatic cholelithiasis     Verbal Laurie Rios is a 22 y.o. female    1.  We will proceed to the OR for a lap cholecystectomy. 2. All risks and benefits were discussed with the patient to generally include: infection, bleeding, possible need for post op ERCP, damage to the bile ducts, and bile leak. Alternatives were offered and described.  All questions were answered and the patient voiced understanding of the procedure and wishes to proceed at this point with a laparoscopic cholecystectomy           No follow-ups on file.   Laurie Ok, MD, [facility], [facility]

## 2023-02-24 ENCOUNTER — Other Ambulatory Visit: Payer: Self-pay

## 2023-02-24 ENCOUNTER — Encounter (HOSPITAL_COMMUNITY): Payer: Self-pay | Admitting: General Surgery

## 2023-02-24 NOTE — Progress Notes (Signed)
Laurie Rios denies chest pain or shortness of breath. Patient denies having any s/s of Covid in her household, also denies any known exposure to Covid. Laurie Rios denies any s/s of upper or lower respiratory infection.

## 2023-02-25 ENCOUNTER — Encounter (HOSPITAL_COMMUNITY)
Admission: RE | Disposition: A | Discharge status: Home / Self Care | Payer: Self-pay | Source: Home / Self Care | Attending: General Surgery

## 2023-02-25 ENCOUNTER — Encounter (HOSPITAL_COMMUNITY): Payer: Self-pay | Admitting: General Surgery

## 2023-02-25 ENCOUNTER — Ambulatory Visit (HOSPITAL_BASED_OUTPATIENT_CLINIC_OR_DEPARTMENT_OTHER): Payer: Commercial Managed Care - PPO | Admitting: Certified Registered"

## 2023-02-25 ENCOUNTER — Other Ambulatory Visit: Payer: Self-pay

## 2023-02-25 ENCOUNTER — Ambulatory Visit (HOSPITAL_COMMUNITY)
Admission: RE | Admit: 2023-02-25 | Discharge: 2023-02-25 | Disposition: A | Discharge status: Home / Self Care | Payer: Commercial Managed Care - PPO | Attending: General Surgery | Admitting: General Surgery

## 2023-02-25 ENCOUNTER — Ambulatory Visit (HOSPITAL_COMMUNITY): Payer: Commercial Managed Care - PPO | Admitting: Certified Registered"

## 2023-02-25 DIAGNOSIS — K8010 Calculus of gallbladder with chronic cholecystitis without obstruction: Secondary | ICD-10-CM | POA: Diagnosis present

## 2023-02-25 DIAGNOSIS — Z87891 Personal history of nicotine dependence: Secondary | ICD-10-CM | POA: Insufficient documentation

## 2023-02-25 DIAGNOSIS — F418 Other specified anxiety disorders: Secondary | ICD-10-CM | POA: Diagnosis not present

## 2023-02-25 DIAGNOSIS — Z6841 Body Mass Index (BMI) 40.0 and over, adult: Secondary | ICD-10-CM | POA: Diagnosis not present

## 2023-02-25 DIAGNOSIS — K8020 Calculus of gallbladder without cholecystitis without obstruction: Secondary | ICD-10-CM | POA: Diagnosis not present

## 2023-02-25 HISTORY — DX: Family history of other specified conditions: Z84.89

## 2023-02-25 HISTORY — PX: CHOLECYSTECTOMY: SHX55

## 2023-02-25 HISTORY — DX: Headache, unspecified: R51.9

## 2023-02-25 HISTORY — DX: Gastro-esophageal reflux disease without esophagitis: K21.9

## 2023-02-25 LAB — POCT PREGNANCY, URINE: Preg Test, Ur: NEGATIVE

## 2023-02-25 LAB — CBC
HCT: 37.6 % (ref 36.0–46.0)
Hemoglobin: 12.5 g/dL (ref 12.0–15.0)
MCH: 29.2 pg (ref 26.0–34.0)
MCHC: 33.2 g/dL (ref 30.0–36.0)
MCV: 87.9 fL (ref 80.0–100.0)
Platelets: 301 10*3/uL (ref 150–400)
RBC: 4.28 MIL/uL (ref 3.87–5.11)
RDW: 12.4 % (ref 11.5–15.5)
WBC: 7.3 10*3/uL (ref 4.0–10.5)
nRBC: 0 % (ref 0.0–0.2)

## 2023-02-25 SURGERY — LAPAROSCOPIC CHOLECYSTECTOMY
Anesthesia: General

## 2023-02-25 MED ORDER — ONDANSETRON HCL 4 MG/2ML IJ SOLN
INTRAMUSCULAR | Status: DC | PRN
  Administered 2023-02-25: 4 mg via INTRAVENOUS

## 2023-02-25 MED ORDER — OXYCODONE HCL 5 MG/5ML PO SOLN: 5.0000 mg | Freq: Once | ORAL | Status: AC | PRN

## 2023-02-25 MED ORDER — PHENYLEPHRINE HCL (PRESSORS) 10 MG/ML IV SOLN
INTRAVENOUS | Status: DC | PRN
  Administered 2023-02-25: 80 ug via INTRAVENOUS

## 2023-02-25 MED ORDER — FENTANYL CITRATE (PF) 250 MCG/5ML IJ SOLN
INTRAMUSCULAR | Status: DC | PRN
  Administered 2023-02-25 (×2): 50 ug via INTRAVENOUS
  Administered 2023-02-25: 100 ug via INTRAVENOUS
  Administered 2023-02-25: 50 ug via INTRAVENOUS

## 2023-02-25 MED ORDER — CHLORHEXIDINE GLUCONATE 0.12 % MT SOLN
15.0000 mL | Freq: Once | OROMUCOSAL | Status: AC
  Administered 2023-02-25: 15 mL via OROMUCOSAL
  Filled 2023-02-25: qty 15

## 2023-02-25 MED ORDER — ONDANSETRON HCL 4 MG/2ML IJ SOLN
INTRAMUSCULAR | Status: AC
  Filled 2023-02-25: qty 2

## 2023-02-25 MED ORDER — SUGAMMADEX SODIUM 200 MG/2ML IV SOLN
INTRAVENOUS | Status: DC | PRN
  Administered 2023-02-25: 100 mg via INTRAVENOUS
  Administered 2023-02-25: 200 mg via INTRAVENOUS

## 2023-02-25 MED ORDER — ACETAMINOPHEN 500 MG PO TABS: 1000.0000 mg | ORAL_TABLET | ORAL | Status: DC

## 2023-02-25 MED ORDER — LIDOCAINE 2% (20 MG/ML) 5 ML SYRINGE
INTRAMUSCULAR | Status: AC
  Filled 2023-02-25: qty 5

## 2023-02-25 MED ORDER — LIDOCAINE 2% (20 MG/ML) 5 ML SYRINGE
INTRAMUSCULAR | Status: DC | PRN
  Administered 2023-02-25: 60 mg via INTRAVENOUS

## 2023-02-25 MED ORDER — TRAMADOL HCL 50 MG PO TABS: 50.0000 mg | ORAL_TABLET | Freq: Four times a day (QID) | ORAL | 0 refills | Status: AC | PRN

## 2023-02-25 MED ORDER — CHLORHEXIDINE GLUCONATE CLOTH 2 % EX PADS: 6.0000 | MEDICATED_PAD | Freq: Once | CUTANEOUS | Status: DC

## 2023-02-25 MED ORDER — OXYCODONE HCL 5 MG PO TABS
5.0000 mg | ORAL_TABLET | Freq: Once | ORAL | Status: AC | PRN
  Administered 2023-02-25: 5 mg via ORAL

## 2023-02-25 MED ORDER — CELECOXIB 200 MG PO CAPS
200.0000 mg | ORAL_CAPSULE | Freq: Once | ORAL | Status: AC
  Administered 2023-02-25: 200 mg via ORAL
  Filled 2023-02-25: qty 1

## 2023-02-25 MED ORDER — MIDAZOLAM HCL 2 MG/2ML IJ SOLN
INTRAMUSCULAR | Status: AC
  Filled 2023-02-25: qty 2

## 2023-02-25 MED ORDER — BUPIVACAINE-EPINEPHRINE (PF) 0.25% -1:200000 IJ SOLN
INTRAMUSCULAR | Status: AC
  Filled 2023-02-25: qty 30

## 2023-02-25 MED ORDER — ROCURONIUM BROMIDE 10 MG/ML (PF) SYRINGE
PREFILLED_SYRINGE | INTRAVENOUS | Status: DC | PRN
  Administered 2023-02-25: 70 mg via INTRAVENOUS

## 2023-02-25 MED ORDER — MIDAZOLAM HCL 2 MG/2ML IJ SOLN
INTRAMUSCULAR | Status: DC | PRN
  Administered 2023-02-25: 1 mg via INTRAVENOUS

## 2023-02-25 MED ORDER — OXYCODONE HCL 5 MG PO TABS
ORAL_TABLET | ORAL | Status: AC
  Filled 2023-02-25: qty 1

## 2023-02-25 MED ORDER — ROCURONIUM BROMIDE 10 MG/ML (PF) SYRINGE
PREFILLED_SYRINGE | INTRAVENOUS | Status: AC
  Filled 2023-02-25: qty 10

## 2023-02-25 MED ORDER — SUCCINYLCHOLINE CHLORIDE 200 MG/10ML IV SOSY
PREFILLED_SYRINGE | INTRAVENOUS | Status: DC | PRN
  Administered 2023-02-25: 120 mg via INTRAVENOUS

## 2023-02-25 MED ORDER — ACETAMINOPHEN 500 MG PO TABS
1000.0000 mg | ORAL_TABLET | Freq: Once | ORAL | Status: AC
  Administered 2023-02-25: 1000 mg via ORAL
  Filled 2023-02-25: qty 2

## 2023-02-25 MED ORDER — FENTANYL CITRATE (PF) 100 MCG/2ML IJ SOLN
INTRAMUSCULAR | Status: AC
  Filled 2023-02-25: qty 2

## 2023-02-25 MED ORDER — BUPIVACAINE-EPINEPHRINE (PF) 0.25% -1:200000 IJ SOLN
INTRAMUSCULAR | Status: DC | PRN
  Administered 2023-02-25: 10 mL

## 2023-02-25 MED ORDER — FENTANYL CITRATE (PF) 100 MCG/2ML IJ SOLN
25.0000 ug | INTRAMUSCULAR | Status: DC | PRN
  Administered 2023-02-25 (×3): 50 ug via INTRAVENOUS

## 2023-02-25 MED ORDER — DEXAMETHASONE SODIUM PHOSPHATE 10 MG/ML IJ SOLN
INTRAMUSCULAR | Status: AC
  Filled 2023-02-25: qty 1

## 2023-02-25 MED ORDER — PROPOFOL 10 MG/ML IV BOLUS
INTRAVENOUS | Status: DC | PRN
  Administered 2023-02-25: 200 mg via INTRAVENOUS

## 2023-02-25 MED ORDER — FENTANYL CITRATE (PF) 250 MCG/5ML IJ SOLN
INTRAMUSCULAR | Status: AC
  Filled 2023-02-25: qty 5

## 2023-02-25 MED ORDER — DEXAMETHASONE SODIUM PHOSPHATE 10 MG/ML IJ SOLN
INTRAMUSCULAR | Status: DC | PRN
  Administered 2023-02-25: 5 mg via INTRAVENOUS

## 2023-02-25 MED ORDER — LACTATED RINGERS IV SOLN: INTRAVENOUS | Status: DC

## 2023-02-25 MED ORDER — 0.9 % SODIUM CHLORIDE (POUR BTL) OPTIME
TOPICAL | Status: DC | PRN
  Administered 2023-02-25: 1000 mL

## 2023-02-25 MED ORDER — ENSURE PRE-SURGERY PO LIQD: 296.0000 mL | Freq: Once | ORAL | Status: DC

## 2023-02-25 MED ORDER — ORAL CARE MOUTH RINSE: 15.0000 mL | Freq: Once | OROMUCOSAL | Status: AC

## 2023-02-25 MED ORDER — CEFAZOLIN IN SODIUM CHLORIDE 3-0.9 GM/100ML-% IV SOLN
3.0000 g | INTRAVENOUS | Status: AC
  Administered 2023-02-25: 3 g via INTRAVENOUS
  Filled 2023-02-25: qty 100

## 2023-02-25 MED ORDER — PROPOFOL 10 MG/ML IV BOLUS
INTRAVENOUS | Status: AC
  Filled 2023-02-25: qty 20

## 2023-02-25 MED ORDER — PROMETHAZINE HCL 25 MG/ML IJ SOLN: 6.2500 mg | INTRAMUSCULAR | Status: DC | PRN

## 2023-02-25 SURGICAL SUPPLY — 42 items
ADH SKN CLS APL DERMABOND .7 (GAUZE/BANDAGES/DRESSINGS) ×1
APL PRP STRL LF DISP 70% ISPRP (MISCELLANEOUS) ×1
BAG COUNTER SPONGE SURGICOUNT (BAG) ×1 IMPLANT
BAG SPEC RTRVL 10 TROC 200 (ENDOMECHANICALS) ×1
BAG SPNG CNTER NS LX DISP (BAG) ×1
CANISTER SUCT 3000ML PPV (MISCELLANEOUS) ×1 IMPLANT
CHLORAPREP W/TINT 26 (MISCELLANEOUS) ×1 IMPLANT
CLIP LIGATING HEMO O LOK GREEN (MISCELLANEOUS) ×1 IMPLANT
COVER SURGICAL LIGHT HANDLE (MISCELLANEOUS) ×1 IMPLANT
COVER TRANSDUCER ULTRASND (DRAPES) ×1 IMPLANT
DERMABOND ADVANCED .7 DNX12 (GAUZE/BANDAGES/DRESSINGS) ×1 IMPLANT
ELECT REM PT RETURN 9FT ADLT (ELECTROSURGICAL) ×1
ELECTRODE REM PT RTRN 9FT ADLT (ELECTROSURGICAL) ×1 IMPLANT
GLOVE BIO SURGEON STRL SZ7.5 (GLOVE) ×2 IMPLANT
GOWN STRL REUS W/ TWL LRG LVL3 (GOWN DISPOSABLE) ×2 IMPLANT
GOWN STRL REUS W/ TWL XL LVL3 (GOWN DISPOSABLE) ×1 IMPLANT
GOWN STRL REUS W/TWL LRG LVL3 (GOWN DISPOSABLE) ×2
GOWN STRL REUS W/TWL XL LVL3 (GOWN DISPOSABLE) ×1
GRASPER SUT TROCAR 14GX15 (MISCELLANEOUS) ×1 IMPLANT
IRRIG SUCT STRYKERFLOW 2 WTIP (MISCELLANEOUS) ×1
IRRIGATION SUCT STRKRFLW 2 WTP (MISCELLANEOUS) ×1 IMPLANT
KIT BASIN OR (CUSTOM PROCEDURE TRAY) ×1 IMPLANT
KIT TURNOVER KIT B (KITS) ×1 IMPLANT
NDL INSUFFLATION 14GA 120MM (NEEDLE) ×1 IMPLANT
NEEDLE INSUFFLATION 14GA 120MM (NEEDLE) ×1 IMPLANT
NS IRRIG 1000ML POUR BTL (IV SOLUTION) ×1 IMPLANT
PAD ARMBOARD 7.5X6 YLW CONV (MISCELLANEOUS) ×1 IMPLANT
POUCH LAPAROSCOPIC INSTRUMENT (MISCELLANEOUS) ×1 IMPLANT
POUCH RETRIEVAL ECOSAC 10 (ENDOMECHANICALS) IMPLANT
POUCH RETRIEVAL ECOSAC 10MM (ENDOMECHANICALS) ×1
SCISSORS LAP 5X35 DISP (ENDOMECHANICALS) ×1 IMPLANT
SET TUBE SMOKE EVAC HIGH FLOW (TUBING) ×1 IMPLANT
SLEEVE Z-THREAD 5X100MM (TROCAR) ×1 IMPLANT
SPECIMEN JAR SMALL (MISCELLANEOUS) ×1 IMPLANT
SUT MNCRL AB 4-0 PS2 18 (SUTURE) ×1 IMPLANT
TOWEL GREEN STERILE (TOWEL DISPOSABLE) ×1 IMPLANT
TOWEL GREEN STERILE FF (TOWEL DISPOSABLE) ×1 IMPLANT
TRAY LAPAROSCOPIC MC (CUSTOM PROCEDURE TRAY) ×1 IMPLANT
TROCAR 11X100 Z THREAD (TROCAR) ×1 IMPLANT
TROCAR Z-THREAD OPTICAL 5X100M (TROCAR) ×1 IMPLANT
WARMER LAPAROSCOPE (MISCELLANEOUS) ×1 IMPLANT
WATER STERILE IRR 1000ML POUR (IV SOLUTION) ×1 IMPLANT

## 2023-02-25 NOTE — Interval H&P Note (Signed)
History and Physical Interval Note:  02/25/2023 10:56 AM  Laurie Rios  has presented today for surgery, with the diagnosis of GALLSTONES.  The various methods of treatment have been discussed with the patient and family. After consideration of risks, benefits and other options for treatment, the patient has consented to  Procedure(s): LAPAROSCOPIC CHOLECYSTECTOMY (N/A) as a surgical intervention.  The patient's history has been reviewed, patient examined, no change in status, stable for surgery.  I have reviewed the patient's chart and labs.  Questions were answered to the patient's satisfaction.     Ralene Ok

## 2023-02-25 NOTE — Discharge Instructions (Signed)
[facility] ______[facility] LAPAROSCOPIC SURGERY: POST OP INSTRUCTIONS Always review your discharge instruction sheet given to you by the facility where your surgery was performed. IF YOU HAVE DISABILITY OR FAMILY LEAVE FORMS, YOU MUST BRING THEM TO THE OFFICE FOR PROCESSING.   DO NOT GIVE THEM TO YOUR DOCTOR.  A prescription for pain medication may be given to you upon discharge.  Take your pain medication as prescribed, if needed.  If narcotic pain medicine is not needed, then you may take acetaminophen (Tylenol) or ibuprofen (Advil) as needed. Take your usually prescribed medications unless otherwise directed. If you need a refill on your pain medication, please contact your pharmacy.  They will contact our office to request authorization. Prescriptions will not be filled after 5pm or on week-ends. You should follow a light diet the first few days after arrival home, such as soup and crackers, etc.  Be sure to include lots of fluids daily. Most patients will experience some swelling and bruising in the area of the incisions.  Ice packs will help.  Swelling and bruising can take several days to resolve.  It is common to experience some constipation if taking pain medication after surgery.  Increasing fluid intake and taking a stool softener (such as Colace) will usually help or prevent this problem from occurring.  A mild laxative (Milk of Magnesia or Miralax) should be taken according to package instructions if there are no bowel movements after 48 hours. Unless discharge instructions indicate otherwise, you may remove your bandages 24-48 hours after surgery, and you may shower at that time.  You may have steri-strips (small skin tapes) in place directly over the incision.  These strips should be left on the skin for 7-10 days.  If your surgeon used skin glue on the incision, you may shower in 24 hours.  The glue will flake off over the next 2-3 weeks.  Any sutures or staples will be  removed at the office during your follow-up visit. ACTIVITIES:  You may resume regular (light) daily activities beginning the next day--such as daily self-care, walking, climbing stairs--gradually increasing activities as tolerated.  You may have sexual intercourse when it is comfortable.  Refrain from any heavy lifting or straining until approved by your doctor. You may drive when you are no longer taking prescription pain medication, you can comfortably wear a seatbelt, and you can safely maneuver your car and apply brakes. RETURN TO WORK:  __________________________________________________________ You should see your doctor in the office for a follow-up appointment approximately 2-3 weeks after your surgery.  Make sure that you call for this appointment within a day or two after you arrive home to insure a convenient appointment time. OTHER INSTRUCTIONS: __________________________________________________________________________________________________________________________ __________________________________________________________________________________________________________________________ WHEN TO CALL YOUR DOCTOR: Fever over 101.0 Inability to urinate Continued bleeding from incision. Increased pain, redness, or drainage from the incision. Increasing abdominal pain  The clinic staff is available to answer your questions during regular business hours.  Please don't hesitate to call and ask to speak to one of the nurses for clinical concerns.  If you have a medical emergency, go to the nearest emergency room or call 911.  A surgeon from [facility] is always on call at the hospital. [address], [address], [place], [place]  [address] ? [address] [address], [place], [place]   [address] [phone] ? [phone] ? FAX [phone] Web site: [email or web address]  

## 2023-02-25 NOTE — Anesthesia Postprocedure Evaluation (Signed)
Anesthesia Post Note  Patient: Laurie Rios  Procedure(s) Performed: LAPAROSCOPIC CHOLECYSTECTOMY     Patient location during evaluation: PACU Anesthesia Type: General Level of consciousness: awake and alert Pain management: pain level controlled Vital Signs Assessment: post-procedure vital signs reviewed and stable Respiratory status: spontaneous breathing, nonlabored ventilation and respiratory function stable Cardiovascular status: stable and blood pressure returned to baseline Anesthetic complications: no   No notable events documented.  Last Vitals:  Vitals:   02/25/23 1330 02/25/23 1345  BP: (!) 135/99 (!) 138/92  Pulse: 62 74  Resp: 15 13  Temp:    SpO2: 96% 99%    Last Pain:  Vitals:   02/25/23 1330  TempSrc:   PainSc: [name]

## 2023-02-25 NOTE — Anesthesia Procedure Notes (Signed)
Procedure Name: Intubation Date/Time: 02/25/2023 11:39 AM  Performed by: Reggie Pile, CRNAPre-anesthesia Checklist: Patient identified, Emergency Drugs available, Suction available and Patient being monitored Patient Re-evaluated:Patient Re-evaluated prior to induction Oxygen Delivery Method: Circle system utilized Preoxygenation: Pre-oxygenation with 100% oxygen Induction Type: IV induction, Rapid sequence and Cricoid Pressure applied Ventilation: Mask ventilation without difficulty Laryngoscope Size: Mac and 3 Grade View: Grade I Tube type: Oral Tube size: 7.0 mm Number of attempts: 1 Airway Equipment and Method: Stylet and Oral airway Placement Confirmation: ETT inserted through vocal cords under direct vision, positive ETCO2 and breath sounds checked- equal and bilateral Secured at: 21 cm Tube secured with: Tape Dental Injury: Teeth and Oropharynx as per pre-operative assessment

## 2023-02-25 NOTE — Transfer of Care (Signed)
Immediate Anesthesia Transfer of Care Note  Patient: ALEXIE MANGRUM  Procedure(s) Performed: LAPAROSCOPIC CHOLECYSTECTOMY  Patient Location: PACU  Anesthesia Type:General  Level of Consciousness: awake, alert , and oriented  Airway & Oxygen Therapy: Patient Spontanous Breathing and Patient connected to nasal cannula oxygen  Post-op Assessment: Report given to RN and Post -op Vital signs reviewed and stable  Post vital signs: Reviewed and stable  Last Vitals:  Vitals Value Taken Time  BP 146/83 02/25/23 1226  Temp    Pulse 85 02/25/23 1228  Resp 21 02/25/23 1228  SpO2 96 % 02/25/23 1228  Vitals shown include unvalidated device data.  Last Pain:  Vitals:   02/25/23 1001  TempSrc:   PainSc: 0-No pain         Complications: No notable events documented.

## 2023-02-25 NOTE — Op Note (Signed)
02/25/2023  12:16 PM  PATIENT:  Laurie Rios  22 y.o. female  PRE-OPERATIVE DIAGNOSIS:  GALLSTONES  POST-OPERATIVE DIAGNOSIS:  GALLSTONES  PROCEDURE:  Procedure(s): LAPAROSCOPIC CHOLECYSTECTOMY (N/A)  SURGEON:  Surgeon(s) and Role:    Ralene Ok, MD - Primary   ANESTHESIA:   local and general  EBL:  5 mL   BLOOD ADMINISTERED:none  DRAINS: none   LOCAL MEDICATIONS USED:  BUPIVICAINE   SPECIMEN:  Source of Specimen:  gallbladder  DISPOSITION OF SPECIMEN:  PATHOLOGY  COUNTS:  YES  TOURNIQUET:  * No tourniquets in log *  DICTATION: .Dragon Dictation  EBL: [id number]   Complications: none   Counts: reported as correct x 2   Findings:chronic inflammation of the gallbladder  Indications for procedure: Pt is a 62F with RUQ pain and seen to have gallstones.   Details of the procedure: The patient was taken to the operating and placed in the supine position with bilateral SCDs in place. A time out was called and all facts were verified. A pneumoperitoneum was obtained via A Veress needle technique to a pressure of 47m of mercury. A 552mtrochar was then placed in the right upper quadrant under visualization, and there were no injuries to any abdominal organs. A 11 mm port was then placed in the umbilical region after infiltrating with local anesthesia under direct visualization. A second epigastric port was placed under direct visualization.   The gallbladder was identified and retracted, the peritoneum was then sharply dissected from the gallbladder and this dissection was carried down to Calot's triangle. The cystic duct was identified and dissected circumferentially and seen going into the gallbladder 360.  The cystic artery was dissected away from the surrounding tissues.   The critical angle was obtained.    2 clips were placed proximally one distally and the cystic duct transected. The cystic artery was identified and 2 clips placed proximally and one distally and  transected. We then proceeded to remove the gallbladder off the hepatic fossa with Bovie cautery. A retrieval bag was then placed in the abdomen and gallbladder placed in the bag. The hepatic fossa was then reexamined and hemostasis was achieved with Bovie cautery and was excellent at this portion of the case. The subhepatic fossa and perihepatic fossa was then irrigated until the effluent was clear. The specimen bag and specimen were removed from the abdominal cavity.  The 11 mm trocar fascia was reapproximated with the Endo Close #1 Vicryl x1. The pneumoperitoneum was evacuated and all trochars removed under direct visulalization. The skin was then closed with 4-0 Monocryl and the skin dressed with Dermabond. The patient was awaken from general anesthesia and taken to the recovery room in stable condition.    PLAN OF CARE: Discharge to home after PACU  PATIENT DISPOSITION:  PACU - hemodynamically stable.   Delay start of Pharmacological VTE agent (>24hrs) due to surgical blood loss or risk of bleeding: not applicable

## 2023-02-25 NOTE — Anesthesia Preprocedure Evaluation (Addendum)
Anesthesia Evaluation  Patient identified by MRN, date of birth, ID band Patient awake    Reviewed: Allergy & Precautions, NPO status , Patient's Chart, lab work & pertinent test results  History of Anesthesia Complications (+) Family history of anesthesia reaction and history of anesthetic complications (maternal grandmother gets tremors with anesthesia, denies hx of MH or any life-threatening problems)  Airway Mallampati: I  TM Distance: >3 FB Neck ROM: Full    Dental  (+) Dental Advisory Given, Teeth Intact   Pulmonary neg pulmonary ROS   Pulmonary exam normal        Cardiovascular negative cardio ROS Normal cardiovascular exam     Neuro/Psych  Headaches PSYCHIATRIC DISORDERS Anxiety Depression       GI/Hepatic Neg liver ROS,GERD  Medicated,,  Endo/Other    Morbid obesity  Renal/GU negative Renal ROS     Musculoskeletal negative musculoskeletal ROS (+)    Abdominal  (+) + obese  Peds  Hematology negative hematology ROS (+)   Anesthesia Other Findings   Reproductive/Obstetrics  Hx gestational HTN PCOS                              Anesthesia Physical Anesthesia Plan  ASA: 3  Anesthesia Plan: General   Post-op Pain Management: Tylenol PO (pre-op)* and Celebrex PO (pre-op)*   Induction: Intravenous  PONV Risk Score and Plan: 3 and Treatment may vary due to age or medical condition, Ondansetron, Dexamethasone, Midazolam and Scopolamine patch - Pre-op  Airway Management Planned: Oral ETT  Additional Equipment: None  Intra-op Plan:   Post-operative Plan: Extubation in OR  Informed Consent: I have reviewed the patients History and Physical, chart, labs and discussed the procedure including the risks, benefits and alternatives for the proposed anesthesia with the patient or authorized representative who has indicated his/her understanding and acceptance.     Dental advisory  given  Plan Discussed with: CRNA and Anesthesiologist  Anesthesia Plan Comments:        Anesthesia Quick Evaluation

## 2023-02-26 ENCOUNTER — Encounter (HOSPITAL_COMMUNITY): Payer: Self-pay | Admitting: General Surgery

## 2023-02-26 LAB — SURGICAL PATHOLOGY
